# Patient Record
Sex: Male | Born: 1940
Health system: Southern US, Community
[De-identification: ages and names within clinical notes are randomized; demographics above are authoritative.]

## PROBLEM LIST (undated history)

## (undated) DIAGNOSIS — I729 Aneurysm of unspecified site: Secondary | ICD-10-CM

## (undated) DIAGNOSIS — G8929 Other chronic pain: Principal | ICD-10-CM

## (undated) DIAGNOSIS — E785 Hyperlipidemia, unspecified: Secondary | ICD-10-CM

## (undated) DIAGNOSIS — I82409 Acute embolism and thrombosis of unspecified deep veins of unspecified lower extremity: Secondary | ICD-10-CM

## (undated) DIAGNOSIS — I2510 Atherosclerotic heart disease of native coronary artery without angina pectoris: Secondary | ICD-10-CM

## (undated) DIAGNOSIS — I10 Essential (primary) hypertension: Secondary | ICD-10-CM

## (undated) DIAGNOSIS — C801 Malignant (primary) neoplasm, unspecified: Secondary | ICD-10-CM

## (undated) DIAGNOSIS — M545 Low back pain: Principal | ICD-10-CM

## (undated) HISTORY — DX: Atherosclerotic heart disease of native coronary artery without angina pectoris: I25.10

## (undated) HISTORY — DX: Aneurysm of unspecified site: I72.9

## (undated) HISTORY — PX: HERNIA REPAIR: SHX51

## (undated) HISTORY — PX: SHOULDER SURGERY: SHX246

## (undated) HISTORY — DX: Low back pain: M54.5

## (undated) HISTORY — DX: Acute embolism and thrombosis of unspecified deep veins of unspecified lower extremity: I82.409

## (undated) HISTORY — DX: Hyperlipidemia, unspecified: E78.5

## (undated) HISTORY — DX: Other chronic pain: G89.29

## (undated) HISTORY — DX: Essential (primary) hypertension: I10

---

## 2007-05-20 ENCOUNTER — Encounter: Payer: Self-pay | Admitting: Cardiology

## 2007-10-02 HISTORY — PX: CARDIAC CATHETERIZATION: SHX172

## 2007-10-23 ENCOUNTER — Ambulatory Visit: Payer: Self-pay | Admitting: Cardiology

## 2007-10-23 ENCOUNTER — Encounter: Payer: Self-pay | Admitting: Physician Assistant

## 2007-10-24 ENCOUNTER — Encounter: Payer: Self-pay | Admitting: Physician Assistant

## 2007-10-26 ENCOUNTER — Inpatient Hospital Stay (HOSPITAL_BASED_OUTPATIENT_CLINIC_OR_DEPARTMENT_OTHER): Admission: RE | Admit: 2007-10-26 | Discharge: 2007-10-26 | Payer: Self-pay | Admitting: Cardiology

## 2007-10-26 ENCOUNTER — Ambulatory Visit: Payer: Self-pay | Admitting: Cardiology

## 2007-11-10 ENCOUNTER — Encounter: Payer: Self-pay | Admitting: Cardiology

## 2007-11-13 ENCOUNTER — Ambulatory Visit: Payer: Self-pay | Admitting: Cardiology

## 2007-12-07 ENCOUNTER — Ambulatory Visit: Payer: Self-pay | Admitting: Cardiology

## 2008-06-02 ENCOUNTER — Encounter: Payer: Self-pay | Admitting: Cardiology

## 2008-09-08 DIAGNOSIS — E785 Hyperlipidemia, unspecified: Secondary | ICD-10-CM | POA: Insufficient documentation

## 2008-09-08 DIAGNOSIS — I10 Essential (primary) hypertension: Secondary | ICD-10-CM | POA: Insufficient documentation

## 2008-09-08 DIAGNOSIS — I2510 Atherosclerotic heart disease of native coronary artery without angina pectoris: Secondary | ICD-10-CM | POA: Insufficient documentation

## 2008-12-31 ENCOUNTER — Encounter: Payer: Self-pay | Admitting: Cardiology

## 2009-03-25 ENCOUNTER — Encounter: Payer: Self-pay | Admitting: Cardiology

## 2009-03-26 ENCOUNTER — Ambulatory Visit: Payer: Self-pay | Admitting: Cardiology

## 2010-03-02 NOTE — Miscellaneous (Signed)
  Clinical Lists Changes  Problems: Added new problem of * ANEURYSMAL DILITATION OF RAMUS INTERMEDIUS Observations: Added new observation of PAST MED HX: HYPERTENSION HYPERLIPIDEMIA CAD  nonobstructive....cath...10/2007 Aneurysmal dilitation of ramus interrmedius EF  normal at cath 10/2007 Prior regular Etoh use   (03/25/2009 13:49)       Past History:  Past Medical History: HYPERTENSION HYPERLIPIDEMIA CAD  nonobstructive....cath...10/2007 Aneurysmal dilitation of ramus interrmedius EF  normal at cath 10/2007 Prior regular Etoh use

## 2010-03-02 NOTE — Assessment & Plan Note (Signed)
Summary: NO RECENT OV-WANTS REFILS-JM   Visit Type:  Follow-up Primary Provider:  Rondel Jumbo, PA /  Reuel Boom  CC:  CAD.  History of Present Illness: The patient is seen for followup coronary artery disease.  He underwent catheterization in September 2009.  At that time he had aneurysmal dilatation of the ramus intermedius.  He had no other significant disease.  His blood pressure and cholesterol are being treated well by his primary team.  He's not had any significant chest pain or shortness of breath.   Preventive Screening-Counseling & Management  Alcohol-Tobacco     Smoking Status: quit     Year Started: 20 years     Year Quit: 25 yrs ago  Current Medications (verified): 1)  Simvastatin 40 Mg Tabs (Simvastatin) .... Take One Tablet By Mouth Daily At Bedtime 2)  Amlodipine Besylate 10 Mg Tabs (Amlodipine Besylate) .... Take 1 Tablet By Mouth Once A Day 3)  Imdur 30 Mg Xr24h-Tab (Isosorbide Mononitrate) .... Take 1 Tablet By Mouth Once A Day 4)  Nitrostat 0.4 Mg Subl (Nitroglycerin) .Marland Kitchen.. 1 Tablet Under Tongue At Onset of Chest Pain; You May Repeat Every 5 Minutes For Up To 3 Doses.  Allergies (verified): No Known Drug Allergies  Past History:  Past Medical History: HYPERTENSION HYPERLIPIDEMIA CAD  nonobstructive....cath...10/2007 Aneurysmal dilitation of ramus interrmedius EF  normal at cath 10/2007     Review of Systems       Patient denies fever, chills, headache, sweats, rash, change in vision, change in hearing, chest pain, cough, nausea vomiting, urinary symptoms.  All of the systems are reviewed and are negative.  Vital Signs:  Patient profile:   70 year old male Height:      67 inches Weight:      197.50 pounds BMI:     31.04 Pulse rate:   66 / minute BP sitting:   130 / 81  (left arm) Cuff size:   regular  Vitals Entered By: Hoover Brunette, LPN (March 26, 2009 1:33 PM)  Nutrition Counseling: Patient's BMI is greater than 25 and therefore counseled on  weight management options. CC: CAD Is Patient Diabetic? No Comments routine follow up.  Did have surgery on rotator cuff - left, in December last year.     Physical Exam  General:  patient is stable. Head:  head is atraumatic. Eyes:  no xanthelasma. Neck:  no jugular venous distention. Chest Wall:  no chest wall tenderness. Lungs:  lungs are clear.  Respiratory effort is not labored. Heart:  cardiac exam reveals S1 and S2.  No clicks or significant murmurs. Abdomen:  abdomen is soft. Msk:  no musculoskeletal deformities. Extremities:  no peripheral edema. Skin:  no skin rashes. Psych:  patient is oriented to person time and place.  Affect is normal.   Impression & Recommendations:  Problem # 1:  * ANEURYSMAL DILITATION OF RAMUS INTERMEDIUS The patient has some aneurysmal dilatation of the ramus intermedius.  I believe that it is appropriate for him to be on a small dose of aspirin.  This will be started today 81 mg daily.  EKG is done today and reviewed by me.  EKG is normal.  Problem # 2:  HYPERTENSION, UNSPECIFIED (ICD-401.9)  His updated medication list for this problem includes:    Amlodipine Besylate 10 Mg Tabs (Amlodipine besylate) .Marland Kitchen... Take 1 tablet by mouth once a day  Orders: EKG w/ Interpretation (93000)  Blood pressure is well controlled today.  No change in therapy.  Problem #  3:  HYPERLIPIDEMIA-MIXED (ICD-272.4)  The following medications were removed from the medication list:    Simvastatin 20 Mg Tabs (Simvastatin) .Marland Kitchen... Take 1 tablet by mouth at bedtime His updated medication list for this problem includes:    Simvastatin 40 Mg Tabs (Simvastatin) .Marland Kitchen... Take one tablet by mouth daily at bedtime The patient is on treatment for his lipids.  This will be followed by his primary care team.  I'll see him back in one year for followup.  Patient Instructions: 1)  Your physician wants you to follow-up in: 1 year. You will receive a reminder letter in the mail  one-two months in advance. If you don't receive a letter, please call our office to schedule the follow-up appointment.  2)  Your physician recommends that you continue on your current medications as directed. Please refer to the Current Medication list given to you today. Prescriptions: AMLODIPINE BESYLATE 10 MG TABS (AMLODIPINE BESYLATE) Take 1 tablet by mouth once a day  #30 x 12   Entered by:   Cyril Loosen, RN, BSN   Authorized by:   Talitha Givens, MD, [facility]   Signed by:   Cyril Loosen, RN, BSN on 03/26/2009   Method used:   Electronically to        [facility]* (retail)       [address]       [place], [place]  [address]       Ph: [phone]       Fax: [phone]   RxID:   [id number]

## 2010-03-09 ENCOUNTER — Encounter: Payer: Self-pay | Admitting: Cardiology

## 2010-03-10 ENCOUNTER — Encounter: Payer: Self-pay | Admitting: Cardiology

## 2010-04-05 ENCOUNTER — Ambulatory Visit (INDEPENDENT_AMBULATORY_CARE_PROVIDER_SITE_OTHER): Payer: Medicare Other | Admitting: Cardiology

## 2010-04-05 ENCOUNTER — Encounter: Payer: Self-pay | Admitting: Cardiology

## 2010-04-05 DIAGNOSIS — I2510 Atherosclerotic heart disease of native coronary artery without angina pectoris: Secondary | ICD-10-CM

## 2010-04-13 NOTE — Assessment & Plan Note (Signed)
Summary: 1 YR F/U/TR   Visit Type:  Follow-up Primary Provider:  Boykin Nearing, PA /  Reuel Boom  CC:  CAD.  History of Present Illness: The patient is seen for followup of coronary artery disease.  He is doing very well.  He's had some slight chest discomfort.  Not convinced that his angina.  He had swelling in his right leg and was very nicely evaluated.  His venous Doppler revealed old venous disease.  He had DVT in 2006.  This area was recanalized.  He has been placed on Coumadin for an additional 6 months.  He is feeling better.  D-dimer was done and was normal  Preventive Screening-Counseling & Management  Alcohol-Tobacco     Smoking Status: quit     Year Quit: 1985  Current Medications (verified): 1)  Simvastatin 40 Mg Tabs (Simvastatin) .... Take One Tablet By Mouth Daily At Bedtime 2)  Amlodipine Besylate 10 Mg Tabs (Amlodipine Besylate) .... Take 1 Tablet By Mouth Once A Day 3)  Imdur 30 Mg Xr24h-Tab (Isosorbide Mononitrate) .... Take 1 Tablet By Mouth Once A Day 4)  Nitrostat 0.4 Mg Subl (Nitroglycerin) .Marland Kitchen.. 1 Tablet Under Tongue At Onset of Chest Pain; You May Repeat Every 5 Minutes For Up To 3 Doses. 5)  Coumadin 4 Mg Tabs (Warfarin Sodium) .... Use As Directed Managed Katie Skillman  Allergies (verified): No Known Drug Allergies  Comments:  Nurse/Medical Assistant: The patient's medications and allergies were verbally reviewed with the patient and were updated in the Medication and Allergy Lists.  Past History:  Past Medical History: HYPERTENSION HYPERLIPIDEMIA CAD  nonobstructive....cath...10/2007 Aneurysmal dilitation of ramus interrmedius EF  normal at cath 10/2007 DVT   2006 /  right leg swelling February, 2012... d-dimer normal... venous Doppler recannulized chronic DVT of the right femoral vein and right popliteal vein,  no definite new DVT.... 6 months Coumadin by primary care     Review of Systems       Patient denies fever, chills, headache, sweats,  rash, change in vision, change in hearing, cough, nausea vomiting, urinary symptoms.  All of the systems are reviewed and are negative.  Vital Signs:  Patient profile:   70 year old male Height:      67 inches Weight:      199 pounds BMI:     31.28 Pulse rate:   72 / minute BP sitting:   138 / 91  (left arm) Cuff size:   large  Vitals Entered By: Carlye Grippe (April 05, 2010 2:56 PM)  Nutrition Counseling: Patient's BMI is greater than 25 and therefore counseled on weight management options.  Physical Exam  General:  patient is stable. Eyes:  no xanthelasma. Neck:  no jugular venous extension. Lungs:  lungs are clear respiratory effort is nonlabored. Heart:  cardiac exam reveals S1-S2.  No clicks or significant murmurs. Abdomen:  abdomen is soft. Extremities:  no peripheral edema. Psych:  patient is oriented to person time and place.  Affect is normal.   Impression & Recommendations:  Problem # 1:  * ANEURYSMAL DILITATION OF RAMUS INTERMEDIUS This is followup clinically  Problem # 2:  HYPERTENSION, UNSPECIFIED (ICD-401.9) Diastolic pressure slightly elevated today.  His pressure was normal at his primary doctor's office recently.  No change in therapy.  Problem # 3:  CAD, NATIVE VESSEL (ICD-414.01) Coronary disease is stable.  No change in therapy.  EKG is done today and reviewed by.  There is no significant change.  No further workup.  Followup in one year.  Other Orders: EKG w/ Interpretation (93000)  Patient Instructions: 1)  Your physician wants you to follow-up in: 1 year. You will receive a reminder letter in the mail one-two months in advance. If you don't receive a letter, please call our office to schedule the follow-up appointment. 2)  Your physician recommends that you continue on your current medications as directed. Please refer to the Current Medication list given to you today. Prescriptions: NITROSTAT 0.4 MG SUBL (NITROGLYCERIN) 1 tablet under tongue at  onset of chest pain; you may repeat every 5 minutes for up to 3 doses.  #25 x 3   Entered by:   Cyril Loosen, RN, BSN   Authorized by:   Talitha Givens, MD, [facility]   Signed by:   Cyril Loosen, RN, BSN on 04/05/2010   Method used:   Electronically to        [facility]* (retail)       [address]       [place], [place]  [address]       Ph: [phone]       Fax: [phone]   RxID:   [id number]

## 2010-06-15 NOTE — Cardiovascular Report (Signed)
NAME:  Robert Richards, Robert Richards NO.:  [id number]   MEDICAL RECORD NO.:  [id number]          PATIENT TYPE:  OIB   LOCATION:  1967                         FACILITY:  [facility]   PHYSICIAN:  Rollene Rotunda, MD, FACCDATE OF BIRTH:  1940/09/06   DATE OF PROCEDURE:  10/26/2007  DATE OF DISCHARGE:  10/26/2007                            CARDIAC CATHETERIZATION   PRIMARY CARE Claron Rosencrans:  Dr. Selinda Flavin.   CARDIOLOGIST:  Luis Abed, MD, [facility]   REASON FOR PRESENTATION:  The patient with chest pain consistent with  unstable angina.   PROCEDURE NOTE:  Left heart catheterization was performed via the right  femoral artery.  The artery was cannulated using anterior wall puncture.  A #4-French arterial sheath was inserted via the modified Seldinger  technique.  A preformed Judkins and a pigtail catheter were utilized.  The patient tolerated the procedure well and left the lab in stable  condition.   RESULTS:  Hemodynamics:  LV 159/10, AO 159/94.   Coronaries:  Left main was normal.  The LAD had mid luminal  irregularities.  First diagonal was moderate in size and normal.  Second  and third diagonals were tiny and normal.  The circumflex was normal.  There was mid obtuse marginal, which was large and normal.  The ramus  intermediate was moderate-sized with ostial aneurysmal dilatation.  The  right coronary artery had mid luminal irregularities.  Otherwise, this  was a normal vessel.  There is a moderate size PDA, which was normal.  There were 2 small posterolaterals, which were normal.   Left ventriculogram:  The left ventriculogram was obtained in the RAO  projection.  The EF is 65% with normal wall motion.   CONCLUSION:  Nonobstructive coronary artery disease.  The ramus  intermediate has ostial aneurysmal dilatation.  The EF was normal.   PLAN:  We will consider cardiac CT to evaluate the ostial ramus  intermediate though it would be very doubtful that this is a lesion  causing symptoms.  I will review the cath films first with multiple  partners.      Rollene Rotunda, MD, [facility]  Electronically Signed     JH/MEDQ  D:  10/26/2007  T:  10/27/2007  Job:  [id number]   cc:   Yehuda Mao, MD, [facility]

## 2010-06-15 NOTE — Assessment & Plan Note (Signed)
[facility]                          [facility] NOTE   MAKO, PELFREY                      MRN:          [id number]  DATE:10/23/2007                            DOB:          [birth year]    REFERRING PHYSICIAN:  Selinda Flavin   PRIMARY CARDIOLOGIST:  Luis Abed, MD, [facility]   REASON FOR CONSULTATION:  Mr. Gundrum is a very pleasant 70 year old  male, with no documented history of heart disease, who is now referred  directly from Dr. Dimas Aguas office for evaluation of recent development of  exertional angina pectoris and dyspnea.   Mr. Kloth presents with cardiac risk factors notable for  hypertension, tobacco smoking, age, and family history of premature CAD.  He has no known history of diabetes mellitus and denies history of  hyperlipidemia.   Mr. Walla recently relocated from [place] to [place], where he lives  alone, but remains quite active helping family members.  He has 4 grown  children and numerous grandchildren.  While in [place], he was  evaluated for a single episode of chest pain some 5 years ago, for which  he was kept for overnight observation.  He recalls undergoing routine  treadmill testing, but does not recall being treated with radionuclide  imaging material.  He recalls that the test was negative.   The patient states that he did not have any further chest pain until  these past several weeks, during which he has noted development of  significant exertional dyspnea and easy fatigability.  He also refers to  an episode approximately 1 week ago, where he experienced near-syncope  in the setting of chest tightness and significant shortness breath.  This occurred after he had hold of a hand truck carrying several large  vessels of water.  He symptoms did resolve spontaneously with rest.   Mr. Buch states that these symptoms have been ongoing over the past  3-4 weeks.  He denies any chest discomfort at  rest.  A 12-lead  electrocardiogram, completed earlier today at Dr. Dimas Aguas office,  indicates NSR at 81 bpm with normal axis and poor R-wave progression  through lead V3.  There is an isolated inverted T-wave in lead V3.   ALLERGIES:  No known drug allergies.   HOME MEDICATIONS:  Norvasc 5 mg daily   PAST MEDICAL HISTORY:  1. Hypertension.  2. Right lower extremity DVT in 2007.      a.     Treated with Coumadin for 6 months.   SOCIAL HISTORY:  The patient recently relocated from [place] to [place].  He is separated and has 4 grown children.  He lives alone.  He smokes an  occasional cigar, but stopped smoking cigarettes approximately 20 years  ago.  He drinks alcohol on occasion.   FAMILY HISTORY:  Father died at age 37, reportedly secondary to a  massive heart attack.  Mother is 5 year old, alive and well.   REVIEW OF SYSTEMS:  He denies history of myocardial infarction,  congestive heart failure, or stroke.  He denies history of diabetes  mellitus.  He does  have occasional reflux symptoms.  Reports recent  development of occasional PND, he has had to sleep on 2 to 3 pillows,  but denies any lower extremity edema.  He denies intermittent  claudication.  Otherwise as per HPI, remaining systems negative.   PHYSICAL EXAMINATION:  VITAL SIGNS:  Blood pressure 143/111, pulse 84,  regular, and weight 174.  GENERAL:  A 70 year old male, sitting upright, in no distress.  HEENT:  Normocephalic, atraumatic.  PERRLA.  EOMI.  NECK:  Palpable carotid pulse without bruits; no JVD.  LUNGS:  Clear to auscultation in all fields.  HEART:  Regular rate and rhythm (S1 and S2) positive S4.  No significant  murmurs.  No rubs.  ABDOMEN:  Soft, nontender, and intact bowel sounds.  No epigastric  bruit.  EXTREMITIES:  Palpable femoral pulses without bruits; palpable right  dorsalis pedis pulses with minimally palpable left dorsalis pedis pulse.  Trace pedal edema.  SKIN:  Warm and dry.   MUSCULOSKELETAL:  No gross deformity.  NEUROLOGIC:  No focal deficit.   IMPRESSION:  1. New-onset exertional chest discomfort/dyspnea.      a.     Worrisome for significant coronary artery disease.      b.     History of remote negative routine treadmill test.  2. Multiple cardiac risk factors.      a.     Hypertension.      b.     Tobacco.      c.     Age.      d.     Family history.  3. Status post right lower extremity deep vein thrombosis in 2007.      a.     Treated with Coumadin.   PLAN:  1. Recommendation is to proceed with a diagnostic coronary angiogram      later this week, to be scheduled in our [facility] catheterization lab.      The patient is agreeable with this plan and risks/benefits were      discussed.  2. Add full dose aspirin, Lopressor 25 mg b.i.d., p.r.n., and      nitroglycerin to current medication regimen.  The patient is to      continue on home dose of Norvasc.      Gene Serpe, PA-C       Jonelle Sidle, MD    GS/MedQ  DD: 10/23/2007  DT: 10/24/2007  Job #: [id number]   cc:   Selinda Flavin

## 2010-06-15 NOTE — Assessment & Plan Note (Signed)
[facility]                          [facility] NOTE   Robert, Richards                      MRN:          [id number]  DATE:12/07/2007                            DOB:          October 30, 1940    PRIMARY CARDIOLOGIST:  Luis Abed, MD, [facility]   REASON FOR VISIT:  Scheduled clinic followup.  Please refer to Dr.  Ival Bible recent office note of November 13, 2007, for full details.   Since this last visit, Mr. Robert Richards refers to a single episode of chest  pressure which occurred shortly after he helped his mother back in the  wheelchair.  This was mild and probably relieved by one nitroglycerin  tablet.  He has not had any recurrent symptoms, and otherwise denies any  recent development of exertional angina pectoris.  He otherwise feels  that he is doing extremely well.   Recent workup consisted of a fasting lipid profile, notable for total  cholesterol of 206, triglyceride 72, HDL 85, and LDL 107.  The patient  is currently not on a statin.   MEDICATIONS:  1. Aspirin 325 daily.  2. Lopressor 50 b.i.d.  3. Norvasc 5 daily.   PHYSICAL EXAMINATION:  VITAL SIGNS:  Blood pressure 145/89, pulse 67,  regular weight 181.  GENERAL:  A 70 year old male, sitting upright, in no distress.  HEENT:  Normocephalic, atraumatic.  NECK:  Palpable carotid pulses without bruits; no JVD.  LUNGS:  Clear to auscultation in all fields.  HEART:  Regular rate and rhythm.  No significant murmurs.  No rubs.  ABDOMEN:  Soft, nontender.  EXTREMITIES:  No edema.  NEURO:  No focal deficit.   IMPRESSION:  1. Nonobstructive coronary artery disease.      a.     By recent cardiac catheterization.      b.     Ostial, aneurysmal dilatation of ramus intermedius.      c.     Normal left ventricle.  2. Dyslipidemia.  3. Hypertension.  4. Remote tobacco.   PLAN:  1. Adjust medication regimen with up titration of Norvasc for more      aggressive blood pressure  control.  Addition of Imdur 30 mg daily      for antianginal benefit.  Addition of simvastatin 20      mg daily, for lipid management.  Followup FLP/LFT profile in 12      weeks.  2. Schedule return clinic followup with myself and Dr. Myrtis Ser in 4      months.      Gene Serpe, PA-C  Electronically Signed      Learta Codding, MD,FACC  Electronically Signed   GS/MedQ  DD: 12/07/2007  DT: 12/08/2007  Job #: [id number]   cc:   Selinda Flavin

## 2010-06-15 NOTE — Assessment & Plan Note (Signed)
[facility]                          [facility] NOTE   Robert Richards, Robert Richards                      MRN:          [id number]  DATE:11/13/2007                            DOB:          05-Apr-1940    PRIMARY CARDIOLOGIST:  Luis Abed, MD, [facility].   PRIMARY CARE PHYSICIAN:  Dr. Selinda Flavin   REASON FOR VISIT:  Post cardiac catheterization followup.   HISTORY OF PRESENT ILLNESS:  Robert Richards is a 70 year old gentleman  seen by Dr. Myrtis Ser back on October 23, 2007 (my electronic signature on  that note is incorrect) with a longstanding history of hypertension,  occasional cigar smoking with remote cigarette use history, and a prior  history of regular alcohol use.  He was referred to Dr. Myrtis Ser with a  history of chest pain as well as exertional shortness of breath in the  face of previous reassuring noninvasive ischemic evaluation.  The  patient was referred for a diagnostic cardiac catheterization, which was  performed by Dr. Antoine Poche on October 26, 2007.  This study revealed  fairly reassuring coronary anatomy with mild atherosclerosis  predominately noted in the right coronary artery.  There was description  of ostial aneurysmal dilatation involving a moderate-sized ramus  intermedius, although no clear obstruction was noted.  Dr. Jenene Slicker  note mentions the possibility of further anatomic assessment of this  area with a cardiac CT scan, although in speaking with Dr. Antoine Poche  directly by phone today, this was not arranged and he felt that the area  was likely noncontributory to any symptom complex and could be followed  medically.  No other major aneurysmal change was noted in the remaining  coronaries.  Symptomatically, Robert Richards denies having any further  chest pain.  Of note, he is back on his Norvasc, which he had been out  of for several days prior to his original presentation in addition to  now taking aspirin and Lopressor  regularly.  He has not had to use any  sublingual nitroglycerin and reports that his catheterization site has  healed well.  Today, I reviewed with him implications for long-term  management of high blood pressure as well as understanding his lipid  status.  Clearly, complete smoking cessation would be of benefit as  well.   ALLERGIES:  No known drug allergies.   MEDICATIONS:  1. Aspirin 325 mg p.o. daily.  2. Norvasc 5 mg p.o. daily.  3. Lopressor 50 mg p.o. b.i.d.  4. Sublingual nitroglycerin 0.4 mg p.r.n.   REVIEW OF SYSTEMS:  As described in the history of present illness.  Otherwise negative.   PHYSICAL EXAMINATION:  VITAL SIGNS:  Blood pressure today 150/98  (improved overall), heart rate 63, weight 178 pounds.  The patient is  comfortable, in no acute distress.  HEENT:  Conjunctivae are normal.  Oropharynx is clear.  NECK:  Supple.  No elevated jugular venous pressure, no loud bruits.  No  thyromegaly is noted.  LUNGS:  Clear without labored breathing at rest.  CARDIAC:  Reveals a regular rate and rhythm.  Soft S4.  No S3 gallop.  No pericardial rub.  ABDOMEN:  Soft and nontender.  No active bowel sounds.  EXTREMITIES:  Exhibit no significant pitting edema.  Distal pulses are  2+.  SKIN:  Warm and dry.  MUSCULOSKELETAL:  No kyphosis noted.  NEUROPSYCHIATRIC:  The patient is alert and oriented x3.  Affect is  appropriate.   IMPRESSION AND RECOMMENDATIONS:  Recent history of chest pain, now  resolved, and status post cardiac catheterization with demonstration of  mild coronary atherosclerosis, some ostial aneurysmal change involving a  ramus intermedius (likely noncontributory to symptom complex), in the  face of hypertension with recent medication adjustments.  Robert Richards  is feeling much better symptomatically, denying any active chest pain or  limiting breathlessness.  Today, I reviewed his medications and we  talked about the fact that he may actually need further  uptitration of  his Norvasc depending on his blood pressure trend over time.  We also  spoke about a fasting lipid profile to better understand his lipid  numbers and determine whether he might benefit from use of a long-term  statin medication.  I spoke with Dr. Antoine Poche about the cardiac  catheterization findings and he stated that he would review these films  with some of our interventional colleagues, although felt that the  aneurysmal ostial change of the ramus intermedius was most likely  noncontributory in terms of symptom complex and could be followed on  medical therapy and basic risk factor modification strategies.  Clinical  follow up will be scheduled with Dr. Myrtis Ser over the next several weeks.     Jonelle Sidle, MD  Electronically Signed    SGM/MedQ  DD: 11/13/2007  DT: 11/13/2007  Job #: [id number]   cc:   Luis Abed, MD, [facility]  Selinda Flavin

## 2011-02-08 DIAGNOSIS — I82409 Acute embolism and thrombosis of unspecified deep veins of unspecified lower extremity: Secondary | ICD-10-CM | POA: Diagnosis not present

## 2011-03-08 DIAGNOSIS — I82409 Acute embolism and thrombosis of unspecified deep veins of unspecified lower extremity: Secondary | ICD-10-CM | POA: Diagnosis not present

## 2011-03-14 DIAGNOSIS — I82409 Acute embolism and thrombosis of unspecified deep veins of unspecified lower extremity: Secondary | ICD-10-CM | POA: Diagnosis not present

## 2011-03-14 DIAGNOSIS — M1990 Unspecified osteoarthritis, unspecified site: Secondary | ICD-10-CM | POA: Diagnosis not present

## 2011-03-14 DIAGNOSIS — E782 Mixed hyperlipidemia: Secondary | ICD-10-CM | POA: Diagnosis not present

## 2011-03-14 DIAGNOSIS — I10 Essential (primary) hypertension: Secondary | ICD-10-CM | POA: Diagnosis not present

## 2011-03-21 DIAGNOSIS — I10 Essential (primary) hypertension: Secondary | ICD-10-CM | POA: Diagnosis not present

## 2011-03-21 DIAGNOSIS — M1990 Unspecified osteoarthritis, unspecified site: Secondary | ICD-10-CM | POA: Diagnosis not present

## 2011-03-21 DIAGNOSIS — E782 Mixed hyperlipidemia: Secondary | ICD-10-CM | POA: Diagnosis not present

## 2011-03-21 DIAGNOSIS — I82409 Acute embolism and thrombosis of unspecified deep veins of unspecified lower extremity: Secondary | ICD-10-CM | POA: Diagnosis not present

## 2011-04-01 ENCOUNTER — Encounter: Payer: Self-pay | Admitting: *Deleted

## 2011-04-05 DIAGNOSIS — I82409 Acute embolism and thrombosis of unspecified deep veins of unspecified lower extremity: Secondary | ICD-10-CM | POA: Diagnosis not present

## 2011-04-15 DIAGNOSIS — M2550 Pain in unspecified joint: Secondary | ICD-10-CM | POA: Diagnosis not present

## 2011-04-15 DIAGNOSIS — S93409A Sprain of unspecified ligament of unspecified ankle, initial encounter: Secondary | ICD-10-CM | POA: Diagnosis not present

## 2011-04-18 DIAGNOSIS — M1710 Unilateral primary osteoarthritis, unspecified knee: Secondary | ICD-10-CM | POA: Diagnosis not present

## 2011-04-18 DIAGNOSIS — M25569 Pain in unspecified knee: Secondary | ICD-10-CM | POA: Diagnosis not present

## 2011-05-10 DIAGNOSIS — I82409 Acute embolism and thrombosis of unspecified deep veins of unspecified lower extremity: Secondary | ICD-10-CM | POA: Diagnosis not present

## 2011-06-20 DIAGNOSIS — I82409 Acute embolism and thrombosis of unspecified deep veins of unspecified lower extremity: Secondary | ICD-10-CM | POA: Diagnosis not present

## 2011-07-19 DIAGNOSIS — I82409 Acute embolism and thrombosis of unspecified deep veins of unspecified lower extremity: Secondary | ICD-10-CM | POA: Diagnosis not present

## 2011-07-19 DIAGNOSIS — E782 Mixed hyperlipidemia: Secondary | ICD-10-CM | POA: Diagnosis not present

## 2011-07-19 DIAGNOSIS — N400 Enlarged prostate without lower urinary tract symptoms: Secondary | ICD-10-CM | POA: Diagnosis not present

## 2011-07-19 DIAGNOSIS — I10 Essential (primary) hypertension: Secondary | ICD-10-CM | POA: Diagnosis not present

## 2011-07-26 DIAGNOSIS — M1990 Unspecified osteoarthritis, unspecified site: Secondary | ICD-10-CM | POA: Diagnosis not present

## 2011-07-26 DIAGNOSIS — I10 Essential (primary) hypertension: Secondary | ICD-10-CM | POA: Diagnosis not present

## 2011-07-26 DIAGNOSIS — I82409 Acute embolism and thrombosis of unspecified deep veins of unspecified lower extremity: Secondary | ICD-10-CM | POA: Diagnosis not present

## 2011-07-26 DIAGNOSIS — E782 Mixed hyperlipidemia: Secondary | ICD-10-CM | POA: Diagnosis not present

## 2011-08-16 DIAGNOSIS — I82409 Acute embolism and thrombosis of unspecified deep veins of unspecified lower extremity: Secondary | ICD-10-CM | POA: Diagnosis not present

## 2011-08-30 DIAGNOSIS — I82409 Acute embolism and thrombosis of unspecified deep veins of unspecified lower extremity: Secondary | ICD-10-CM | POA: Diagnosis not present

## 2011-09-27 DIAGNOSIS — I82409 Acute embolism and thrombosis of unspecified deep veins of unspecified lower extremity: Secondary | ICD-10-CM | POA: Diagnosis not present

## 2011-09-30 DIAGNOSIS — I82409 Acute embolism and thrombosis of unspecified deep veins of unspecified lower extremity: Secondary | ICD-10-CM | POA: Diagnosis not present

## 2011-10-07 DIAGNOSIS — I82409 Acute embolism and thrombosis of unspecified deep veins of unspecified lower extremity: Secondary | ICD-10-CM | POA: Diagnosis not present

## 2011-10-14 DIAGNOSIS — I82409 Acute embolism and thrombosis of unspecified deep veins of unspecified lower extremity: Secondary | ICD-10-CM | POA: Diagnosis not present

## 2011-10-18 DIAGNOSIS — I82409 Acute embolism and thrombosis of unspecified deep veins of unspecified lower extremity: Secondary | ICD-10-CM | POA: Diagnosis not present

## 2011-10-25 DIAGNOSIS — I82409 Acute embolism and thrombosis of unspecified deep veins of unspecified lower extremity: Secondary | ICD-10-CM | POA: Diagnosis not present

## 2011-11-07 DIAGNOSIS — I82409 Acute embolism and thrombosis of unspecified deep veins of unspecified lower extremity: Secondary | ICD-10-CM | POA: Diagnosis not present

## 2011-12-06 DIAGNOSIS — I82409 Acute embolism and thrombosis of unspecified deep veins of unspecified lower extremity: Secondary | ICD-10-CM | POA: Diagnosis not present

## 2011-12-16 DIAGNOSIS — I10 Essential (primary) hypertension: Secondary | ICD-10-CM | POA: Diagnosis not present

## 2011-12-16 DIAGNOSIS — M1990 Unspecified osteoarthritis, unspecified site: Secondary | ICD-10-CM | POA: Diagnosis not present

## 2011-12-16 DIAGNOSIS — E782 Mixed hyperlipidemia: Secondary | ICD-10-CM | POA: Diagnosis not present

## 2011-12-20 DIAGNOSIS — E782 Mixed hyperlipidemia: Secondary | ICD-10-CM | POA: Diagnosis not present

## 2011-12-20 DIAGNOSIS — I82409 Acute embolism and thrombosis of unspecified deep veins of unspecified lower extremity: Secondary | ICD-10-CM | POA: Diagnosis not present

## 2011-12-20 DIAGNOSIS — M1990 Unspecified osteoarthritis, unspecified site: Secondary | ICD-10-CM | POA: Diagnosis not present

## 2011-12-20 DIAGNOSIS — I10 Essential (primary) hypertension: Secondary | ICD-10-CM | POA: Diagnosis not present

## 2012-01-03 DIAGNOSIS — I82409 Acute embolism and thrombosis of unspecified deep veins of unspecified lower extremity: Secondary | ICD-10-CM | POA: Diagnosis not present

## 2012-01-17 DIAGNOSIS — I82409 Acute embolism and thrombosis of unspecified deep veins of unspecified lower extremity: Secondary | ICD-10-CM | POA: Diagnosis not present

## 2012-01-23 DIAGNOSIS — I82409 Acute embolism and thrombosis of unspecified deep veins of unspecified lower extremity: Secondary | ICD-10-CM | POA: Diagnosis not present

## 2012-02-07 DIAGNOSIS — I82409 Acute embolism and thrombosis of unspecified deep veins of unspecified lower extremity: Secondary | ICD-10-CM | POA: Diagnosis not present

## 2012-02-14 DIAGNOSIS — I82409 Acute embolism and thrombosis of unspecified deep veins of unspecified lower extremity: Secondary | ICD-10-CM | POA: Diagnosis not present

## 2012-02-21 DIAGNOSIS — M76899 Other specified enthesopathies of unspecified lower limb, excluding foot: Secondary | ICD-10-CM | POA: Diagnosis not present

## 2012-02-29 DIAGNOSIS — I82409 Acute embolism and thrombosis of unspecified deep veins of unspecified lower extremity: Secondary | ICD-10-CM | POA: Diagnosis not present

## 2012-03-30 DIAGNOSIS — I82409 Acute embolism and thrombosis of unspecified deep veins of unspecified lower extremity: Secondary | ICD-10-CM | POA: Diagnosis not present

## 2012-04-11 DIAGNOSIS — I82409 Acute embolism and thrombosis of unspecified deep veins of unspecified lower extremity: Secondary | ICD-10-CM | POA: Diagnosis not present

## 2012-04-18 DIAGNOSIS — I82409 Acute embolism and thrombosis of unspecified deep veins of unspecified lower extremity: Secondary | ICD-10-CM | POA: Diagnosis not present

## 2012-05-03 DIAGNOSIS — I82409 Acute embolism and thrombosis of unspecified deep veins of unspecified lower extremity: Secondary | ICD-10-CM | POA: Diagnosis not present

## 2012-05-03 DIAGNOSIS — M25559 Pain in unspecified hip: Secondary | ICD-10-CM | POA: Diagnosis not present

## 2012-06-18 DIAGNOSIS — I82409 Acute embolism and thrombosis of unspecified deep veins of unspecified lower extremity: Secondary | ICD-10-CM | POA: Diagnosis not present

## 2012-06-27 DIAGNOSIS — I82409 Acute embolism and thrombosis of unspecified deep veins of unspecified lower extremity: Secondary | ICD-10-CM | POA: Diagnosis not present

## 2012-06-27 DIAGNOSIS — E782 Mixed hyperlipidemia: Secondary | ICD-10-CM | POA: Diagnosis not present

## 2012-06-27 DIAGNOSIS — M1990 Unspecified osteoarthritis, unspecified site: Secondary | ICD-10-CM | POA: Diagnosis not present

## 2012-06-27 DIAGNOSIS — M2550 Pain in unspecified joint: Secondary | ICD-10-CM | POA: Diagnosis not present

## 2012-06-27 DIAGNOSIS — M25559 Pain in unspecified hip: Secondary | ICD-10-CM | POA: Diagnosis not present

## 2012-06-27 DIAGNOSIS — I10 Essential (primary) hypertension: Secondary | ICD-10-CM | POA: Diagnosis not present

## 2012-07-03 DIAGNOSIS — I10 Essential (primary) hypertension: Secondary | ICD-10-CM | POA: Diagnosis not present

## 2012-07-03 DIAGNOSIS — I82409 Acute embolism and thrombosis of unspecified deep veins of unspecified lower extremity: Secondary | ICD-10-CM | POA: Diagnosis not present

## 2012-08-01 DIAGNOSIS — I82409 Acute embolism and thrombosis of unspecified deep veins of unspecified lower extremity: Secondary | ICD-10-CM | POA: Diagnosis not present

## 2012-08-22 ENCOUNTER — Encounter (HOSPITAL_COMMUNITY): Payer: Self-pay | Admitting: *Deleted

## 2012-08-22 ENCOUNTER — Emergency Department (HOSPITAL_COMMUNITY)
Admission: EM | Admit: 2012-08-22 | Discharge: 2012-08-23 | Disposition: A | Discharge status: Home / Self Care | Payer: Medicare Other | Attending: Emergency Medicine | Admitting: Emergency Medicine

## 2012-08-22 ENCOUNTER — Emergency Department (HOSPITAL_COMMUNITY): Payer: Medicare Other

## 2012-08-22 DIAGNOSIS — Z79899 Other long term (current) drug therapy: Secondary | ICD-10-CM | POA: Diagnosis not present

## 2012-08-22 DIAGNOSIS — R52 Pain, unspecified: Secondary | ICD-10-CM | POA: Diagnosis not present

## 2012-08-22 DIAGNOSIS — M79605 Pain in left leg: Secondary | ICD-10-CM

## 2012-08-22 DIAGNOSIS — G8929 Other chronic pain: Secondary | ICD-10-CM | POA: Diagnosis not present

## 2012-08-22 DIAGNOSIS — M545 Low back pain, unspecified: Secondary | ICD-10-CM | POA: Insufficient documentation

## 2012-08-22 DIAGNOSIS — Z8679 Personal history of other diseases of the circulatory system: Secondary | ICD-10-CM | POA: Insufficient documentation

## 2012-08-22 DIAGNOSIS — M549 Dorsalgia, unspecified: Secondary | ICD-10-CM

## 2012-08-22 DIAGNOSIS — M5380 Other specified dorsopathies, site unspecified: Secondary | ICD-10-CM | POA: Diagnosis not present

## 2012-08-22 DIAGNOSIS — M62830 Muscle spasm of back: Secondary | ICD-10-CM

## 2012-08-22 DIAGNOSIS — I10 Essential (primary) hypertension: Secondary | ICD-10-CM | POA: Insufficient documentation

## 2012-08-22 DIAGNOSIS — Z9861 Coronary angioplasty status: Secondary | ICD-10-CM | POA: Diagnosis not present

## 2012-08-22 DIAGNOSIS — S79919A Unspecified injury of unspecified hip, initial encounter: Secondary | ICD-10-CM | POA: Diagnosis not present

## 2012-08-22 DIAGNOSIS — Z87828 Personal history of other (healed) physical injury and trauma: Secondary | ICD-10-CM | POA: Insufficient documentation

## 2012-08-22 DIAGNOSIS — Z87891 Personal history of nicotine dependence: Secondary | ICD-10-CM | POA: Diagnosis not present

## 2012-08-22 DIAGNOSIS — I2510 Atherosclerotic heart disease of native coronary artery without angina pectoris: Secondary | ICD-10-CM | POA: Insufficient documentation

## 2012-08-22 DIAGNOSIS — Z862 Personal history of diseases of the blood and blood-forming organs and certain disorders involving the immune mechanism: Secondary | ICD-10-CM | POA: Insufficient documentation

## 2012-08-22 DIAGNOSIS — M25559 Pain in unspecified hip: Secondary | ICD-10-CM | POA: Diagnosis not present

## 2012-08-22 DIAGNOSIS — Z86718 Personal history of other venous thrombosis and embolism: Secondary | ICD-10-CM | POA: Diagnosis not present

## 2012-08-22 DIAGNOSIS — Z8639 Personal history of other endocrine, nutritional and metabolic disease: Secondary | ICD-10-CM | POA: Insufficient documentation

## 2012-08-22 DIAGNOSIS — IMO0002 Reserved for concepts with insufficient information to code with codable children: Secondary | ICD-10-CM | POA: Diagnosis not present

## 2012-08-22 DIAGNOSIS — S79929A Unspecified injury of unspecified thigh, initial encounter: Secondary | ICD-10-CM | POA: Diagnosis not present

## 2012-08-22 MED ORDER — DIAZEPAM 5 MG/ML IJ SOLN
10.0000 mg | Freq: Once | INTRAMUSCULAR | Status: AC
  Administered 2012-08-22: 10 mg via INTRAMUSCULAR
  Filled 2012-08-22: qty 2

## 2012-08-22 MED ORDER — OXYCODONE-ACETAMINOPHEN 5-325 MG PO TABS
1.0000 | ORAL_TABLET | Freq: Once | ORAL | Status: AC
  Administered 2012-08-22: 1 via ORAL

## 2012-08-22 MED ORDER — DEXAMETHASONE SODIUM PHOSPHATE 4 MG/ML IJ SOLN
10.0000 mg | Freq: Once | INTRAMUSCULAR | Status: AC
  Administered 2012-08-22: 10 mg via INTRAMUSCULAR
  Filled 2012-08-22: qty 3

## 2012-08-22 MED ORDER — MORPHINE SULFATE 4 MG/ML IJ SOLN
4.0000 mg | Freq: Once | INTRAMUSCULAR | Status: AC
  Administered 2012-08-22: 4 mg via INTRAMUSCULAR
  Filled 2012-08-22: qty 1

## 2012-08-22 MED ORDER — OXYCODONE-ACETAMINOPHEN 5-325 MG PO TABS
ORAL_TABLET | ORAL | Status: AC
  Filled 2012-08-22: qty 1

## 2012-08-22 NOTE — ED Notes (Signed)
Patient was screaming in pain. When entering the room patient was hanging off the bed to get relief. Let his nurse Payton Doughty RN know he was yelling in pain. Wife asked if there was anything that he could have. Wife at desk very upset that her husband was hurting and there was nothing that she could do. Put pillow under the hip that was hurting but patient states that nothing is giving him relief.

## 2012-08-22 NOTE — ED Notes (Signed)
Pt reporting pain in left hip radiating down leg.  Reporting increased pain today.

## 2012-08-22 NOTE — ED Notes (Signed)
Family member at bedside woke pt and pt once again yelling he was in pain. Family member threatening to ' yank DR in room".  Security asked to make themselves visible.
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
## 2012-08-22 NOTE — ED Notes (Signed)
Patient's wife is standing in the  Hallway waiting for Dr to come in the room. States that she" might jump on the Dr" when they come in the room. Wife is very angry pacing the hallway.

## 2012-08-22 NOTE — ED Notes (Signed)
Pt remains in sever pain . Ice pack applied.

## 2012-08-22 NOTE — ED Notes (Signed)
Pt's wife out to desk asking to have help for husbands pain. EDp notified. protocal initiated of percocet. Charge nurse also notified of family's concerns.

## 2012-08-22 NOTE — ED Notes (Signed)
Pt presents with left lower back and hip pain after raising up from being on his knees. Pt states pain is severe. Denies injury/fall at this time. Bilateral legs remain the same length.

## 2012-08-23 MED ORDER — HYDROCODONE-ACETAMINOPHEN 5-325 MG PO TABS: ORAL_TABLET | ORAL | Status: DC

## 2012-08-23 MED ORDER — PREDNISONE 20 MG PO TABS: ORAL_TABLET | ORAL | Status: DC

## 2012-08-23 MED ORDER — DIAZEPAM 5 MG PO TABS: ORAL_TABLET | ORAL | Status: DC

## 2012-08-28 DIAGNOSIS — M545 Low back pain, unspecified: Secondary | ICD-10-CM | POA: Diagnosis not present

## 2012-08-31 DIAGNOSIS — M47817 Spondylosis without myelopathy or radiculopathy, lumbosacral region: Secondary | ICD-10-CM | POA: Diagnosis not present

## 2012-08-31 DIAGNOSIS — M5126 Other intervertebral disc displacement, lumbar region: Secondary | ICD-10-CM | POA: Diagnosis not present

## 2012-08-31 DIAGNOSIS — M4310 Spondylolisthesis, site unspecified: Secondary | ICD-10-CM | POA: Diagnosis not present

## 2012-09-27 ENCOUNTER — Ambulatory Visit (INDEPENDENT_AMBULATORY_CARE_PROVIDER_SITE_OTHER): Payer: Medicare Other | Admitting: Neurology

## 2012-09-27 ENCOUNTER — Encounter: Payer: Self-pay | Admitting: Neurology

## 2012-09-27 VITALS — BP 155/96 | HR 73 | Ht 66.0 in | Wt 184.5 lb

## 2012-09-27 DIAGNOSIS — M545 Low back pain, unspecified: Secondary | ICD-10-CM

## 2012-09-27 DIAGNOSIS — IMO0002 Reserved for concepts with insufficient information to code with codable children: Secondary | ICD-10-CM

## 2012-09-27 DIAGNOSIS — M5416 Radiculopathy, lumbar region: Secondary | ICD-10-CM

## 2012-09-27 MED ORDER — GABAPENTIN 300 MG PO CAPS: 300.0000 mg | ORAL_CAPSULE | Freq: Three times a day (TID) | ORAL | Status: DC
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
## 2012-09-27 NOTE — Patient Instructions (Signed)
Overall you are doing fairly well but I do want to suggest a few things today:   Remember to drink plenty of fluid, eat healthy meals and do not skip any meals. Try to eat protein with a every meal and eat a healthy snack such as fruit or nuts in between meals. Try to keep a regular sleep-wake schedule and try to exercise daily, particularly in the form of walking, 20-30 minutes a day, if you can.   As far as your medications are concerned, I would like to suggest starting a medication called gabapentin. You will start by taking 1 capsule nightly for 3 days, then increase to 1 capsule twice a day, then increase to 1 capsule three times a day  As far as diagnostic testing: I would like to get an EMG/NCS. We will schedule this test for you  I would like to see you back in 2 to 3 months, sooner if we need to. Please call us with any interim questions, concerns, problems, updates or refill requests.   Please also call us for any test results so we can go over those with you on the phone.  My clinical assistant and will answer any of your questions and relay your messages to me and also relay most of my messages to you.   Our phone number is [phone]. We also have an after hours call service for urgent matters and there is a physician on-call for urgent questions. For any emergencies you know to call 911 or go to the nearest emergency room

## 2012-10-05 DIAGNOSIS — I82409 Acute embolism and thrombosis of unspecified deep veins of unspecified lower extremity: Secondary | ICD-10-CM | POA: Diagnosis not present

## 2012-10-30 ENCOUNTER — Encounter: Payer: Self-pay | Admitting: Neurology

## 2012-10-31 ENCOUNTER — Encounter: Payer: Medicare Other | Admitting: Neurology

## 2012-11-05 DIAGNOSIS — I82409 Acute embolism and thrombosis of unspecified deep veins of unspecified lower extremity: Secondary | ICD-10-CM | POA: Diagnosis not present

## 2012-11-27 ENCOUNTER — Ambulatory Visit: Payer: Medicare Other | Admitting: Neurology

## 2012-12-10 ENCOUNTER — Encounter: Payer: Self-pay | Admitting: Neurology

## 2012-12-10 ENCOUNTER — Ambulatory Visit (INDEPENDENT_AMBULATORY_CARE_PROVIDER_SITE_OTHER): Payer: Medicare Other | Admitting: Neurology

## 2012-12-10 ENCOUNTER — Encounter (INDEPENDENT_AMBULATORY_CARE_PROVIDER_SITE_OTHER): Payer: Self-pay

## 2012-12-10 VITALS — BP 148/89 | HR 81 | Ht 67.0 in | Wt 190.0 lb

## 2012-12-10 DIAGNOSIS — M545 Low back pain, unspecified: Secondary | ICD-10-CM

## 2012-12-10 MED ORDER — GABAPENTIN 400 MG PO CAPS: 400.0000 mg | ORAL_CAPSULE | Freq: Three times a day (TID) | ORAL | Status: DC

## 2012-12-10 NOTE — Addendum Note (Signed)
Addended by: Ramond Marrow on: 12/10/2012 03:43 PM   Modules accepted: Orders
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
## 2012-12-14 ENCOUNTER — Other Ambulatory Visit: Payer: Self-pay | Admitting: Neurology

## 2012-12-14 DIAGNOSIS — M545 Low back pain, unspecified: Secondary | ICD-10-CM

## 2012-12-19 DIAGNOSIS — I82409 Acute embolism and thrombosis of unspecified deep veins of unspecified lower extremity: Secondary | ICD-10-CM | POA: Diagnosis not present

## 2012-12-26 DIAGNOSIS — I82409 Acute embolism and thrombosis of unspecified deep veins of unspecified lower extremity: Secondary | ICD-10-CM | POA: Diagnosis not present

## 2013-01-01 DIAGNOSIS — E782 Mixed hyperlipidemia: Secondary | ICD-10-CM | POA: Diagnosis not present

## 2013-01-01 DIAGNOSIS — I82409 Acute embolism and thrombosis of unspecified deep veins of unspecified lower extremity: Secondary | ICD-10-CM | POA: Diagnosis not present

## 2013-01-01 DIAGNOSIS — I10 Essential (primary) hypertension: Secondary | ICD-10-CM | POA: Diagnosis not present

## 2013-01-01 DIAGNOSIS — M1990 Unspecified osteoarthritis, unspecified site: Secondary | ICD-10-CM | POA: Diagnosis not present

## 2013-01-18 DIAGNOSIS — M5126 Other intervertebral disc displacement, lumbar region: Secondary | ICD-10-CM | POA: Diagnosis not present

## 2013-01-18 DIAGNOSIS — M47817 Spondylosis without myelopathy or radiculopathy, lumbosacral region: Secondary | ICD-10-CM | POA: Diagnosis not present

## 2013-02-06 DIAGNOSIS — I82409 Acute embolism and thrombosis of unspecified deep veins of unspecified lower extremity: Secondary | ICD-10-CM | POA: Diagnosis not present

## 2013-02-20 DIAGNOSIS — I82409 Acute embolism and thrombosis of unspecified deep veins of unspecified lower extremity: Secondary | ICD-10-CM | POA: Diagnosis not present

## 2013-03-06 DIAGNOSIS — I82409 Acute embolism and thrombosis of unspecified deep veins of unspecified lower extremity: Secondary | ICD-10-CM | POA: Diagnosis not present

## 2013-03-13 DIAGNOSIS — I82409 Acute embolism and thrombosis of unspecified deep veins of unspecified lower extremity: Secondary | ICD-10-CM | POA: Diagnosis not present

## 2013-04-09 ENCOUNTER — Ambulatory Visit (INDEPENDENT_AMBULATORY_CARE_PROVIDER_SITE_OTHER): Payer: Medicare Other | Admitting: Neurology

## 2013-04-09 ENCOUNTER — Encounter (INDEPENDENT_AMBULATORY_CARE_PROVIDER_SITE_OTHER): Payer: Self-pay

## 2013-04-09 ENCOUNTER — Encounter: Payer: Self-pay | Admitting: Neurology

## 2013-04-09 VITALS — BP 134/88 | HR 70 | Ht 67.25 in | Wt 186.0 lb

## 2013-04-09 DIAGNOSIS — M545 Low back pain, unspecified: Secondary | ICD-10-CM

## 2013-04-09 NOTE — Patient Instructions (Signed)
Overall you are doing fairly well but I do want to suggest a few things today:   Remember to drink plenty of fluid, eat healthy meals and do not skip any meals. Try to eat protein with a every meal and eat a healthy snack such as fruit or nuts in between meals. Try to keep a regular sleep-wake schedule and try to exercise daily, particularly in the form of walking, 20-30 minutes a day, if you can.   As far as your medications are concerned, I would like to suggest continuing you on gabapentin 300mg  three times a day.  I would like to see you back in 6 months, sooner if we need to. Please call us with any interim questions, concerns, problems, updates or refill requests.   My clinical assistant and will answer any of your questions and relay your messages to me and also relay most of my messages to you.   Our phone number is [phone]. We also have an after hours call service for urgent matters and there is a physician on-call for urgent questions. For any emergencies you know to call 911 or go to the nearest emergency room
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
## 2013-05-02 DIAGNOSIS — I82409 Acute embolism and thrombosis of unspecified deep veins of unspecified lower extremity: Secondary | ICD-10-CM | POA: Diagnosis not present

## 2013-05-06 DIAGNOSIS — I82409 Acute embolism and thrombosis of unspecified deep veins of unspecified lower extremity: Secondary | ICD-10-CM | POA: Diagnosis not present

## 2013-05-13 ENCOUNTER — Telehealth: Payer: Self-pay | Admitting: Neurology

## 2013-05-13 DIAGNOSIS — I82409 Acute embolism and thrombosis of unspecified deep veins of unspecified lower extremity: Secondary | ICD-10-CM | POA: Diagnosis not present

## 2013-05-13 NOTE — Telephone Encounter (Signed)
Last OV says to continue Gabapentin, and does not mention any new medications.  I called the patient back at home, got no answer, no VM.  Called cell, he was not available.  Left message.

## 2013-05-13 NOTE — Telephone Encounter (Signed)
Patient states that Dr. Janann Colonel said he would call in a script to his pharmacy for back pain, patient does not know what it is called. Patient states that he has been to his pharmacy several times already and they don't have anything for him.

## 2013-05-14 NOTE — Telephone Encounter (Signed)
I have not been able to reach this patient.  Will have to wait fir a return call.

## 2013-06-13 DIAGNOSIS — I82409 Acute embolism and thrombosis of unspecified deep veins of unspecified lower extremity: Secondary | ICD-10-CM | POA: Diagnosis not present

## 2013-07-01 DIAGNOSIS — I82409 Acute embolism and thrombosis of unspecified deep veins of unspecified lower extremity: Secondary | ICD-10-CM | POA: Diagnosis not present

## 2013-07-01 DIAGNOSIS — M1990 Unspecified osteoarthritis, unspecified site: Secondary | ICD-10-CM | POA: Diagnosis not present

## 2013-07-01 DIAGNOSIS — E782 Mixed hyperlipidemia: Secondary | ICD-10-CM | POA: Diagnosis not present

## 2013-07-01 DIAGNOSIS — I10 Essential (primary) hypertension: Secondary | ICD-10-CM | POA: Diagnosis not present

## 2013-07-31 DIAGNOSIS — I82409 Acute embolism and thrombosis of unspecified deep veins of unspecified lower extremity: Secondary | ICD-10-CM | POA: Diagnosis not present

## 2013-09-03 DIAGNOSIS — I82409 Acute embolism and thrombosis of unspecified deep veins of unspecified lower extremity: Secondary | ICD-10-CM | POA: Diagnosis not present

## 2013-09-19 DIAGNOSIS — I82409 Acute embolism and thrombosis of unspecified deep veins of unspecified lower extremity: Secondary | ICD-10-CM | POA: Diagnosis not present

## 2013-10-10 ENCOUNTER — Ambulatory Visit: Payer: Medicare Other | Admitting: Neurology

## 2013-10-17 ENCOUNTER — Encounter: Payer: Self-pay | Admitting: Neurology

## 2013-11-20 DIAGNOSIS — I10 Essential (primary) hypertension: Secondary | ICD-10-CM | POA: Diagnosis not present

## 2013-11-20 DIAGNOSIS — E782 Mixed hyperlipidemia: Secondary | ICD-10-CM | POA: Diagnosis not present

## 2013-11-21 DIAGNOSIS — I8290 Acute embolism and thrombosis of unspecified vein: Secondary | ICD-10-CM | POA: Diagnosis not present

## 2013-11-21 DIAGNOSIS — E782 Mixed hyperlipidemia: Secondary | ICD-10-CM | POA: Diagnosis not present

## 2013-12-05 DIAGNOSIS — I8290 Acute embolism and thrombosis of unspecified vein: Secondary | ICD-10-CM | POA: Diagnosis not present

## 2013-12-12 DIAGNOSIS — I10 Essential (primary) hypertension: Secondary | ICD-10-CM | POA: Diagnosis not present

## 2013-12-12 DIAGNOSIS — M1991 Primary osteoarthritis, unspecified site: Secondary | ICD-10-CM | POA: Diagnosis not present

## 2013-12-12 DIAGNOSIS — E782 Mixed hyperlipidemia: Secondary | ICD-10-CM | POA: Diagnosis not present

## 2013-12-12 DIAGNOSIS — I8290 Acute embolism and thrombosis of unspecified vein: Secondary | ICD-10-CM | POA: Diagnosis not present

## 2014-01-06 ENCOUNTER — Other Ambulatory Visit: Payer: Self-pay | Admitting: Neurology

## 2014-01-06 NOTE — Telephone Encounter (Signed)
Former Occupational psychologist patient assigned to Dr Jannifer Franklin

## 2014-01-06 NOTE — Telephone Encounter (Signed)
Last OV note says: I would like to suggest continuing you on gabapentin 300mg  three times a day.

## 2014-01-06 NOTE — Addendum Note (Signed)
Addended by: Norva Pavlov C on: 01/06/2014 06:45 PM   Modules accepted: Orders, Medications

## 2014-01-13 DIAGNOSIS — I8290 Acute embolism and thrombosis of unspecified vein: Secondary | ICD-10-CM | POA: Diagnosis not present

## 2014-01-27 DIAGNOSIS — I8290 Acute embolism and thrombosis of unspecified vein: Secondary | ICD-10-CM | POA: Diagnosis not present

## 2014-03-03 DIAGNOSIS — I8290 Acute embolism and thrombosis of unspecified vein: Secondary | ICD-10-CM | POA: Diagnosis not present

## 2014-03-18 ENCOUNTER — Encounter: Payer: Self-pay | Admitting: Neurology

## 2014-03-18 ENCOUNTER — Ambulatory Visit (INDEPENDENT_AMBULATORY_CARE_PROVIDER_SITE_OTHER): Payer: Medicare Other | Admitting: Neurology

## 2014-03-18 VITALS — BP 133/77 | HR 75 | Ht 67.0 in | Wt 174.8 lb

## 2014-03-18 DIAGNOSIS — G8929 Other chronic pain: Secondary | ICD-10-CM | POA: Diagnosis not present

## 2014-03-18 DIAGNOSIS — M545 Low back pain, unspecified: Secondary | ICD-10-CM | POA: Insufficient documentation

## 2014-03-18 HISTORY — DX: Other chronic pain: G89.29

## 2014-03-18 MED ORDER — GABAPENTIN 300 MG PO CAPS: 300.0000 mg | ORAL_CAPSULE | Freq: Three times a day (TID) | ORAL | Status: DC

## 2014-03-18 NOTE — Patient Instructions (Signed)

## 2014-03-18 NOTE — Progress Notes (Signed)
Reason for visit: Back pain  Robert Richards is an 74 y.o. male  History of present illness:  Robert Richards is a 74 year old right-handed black male with a history of chronic low back pain. He has done very well since last seen, he indicates that he has refrained from lifting heavy objects, and his back is feeling much better. He has never had pain radiating down from the back into the legs, the back pain is always been centered in the back itself. He is on gabapentin that he takes only as needed, not daily. He returns for an evaluation. No other significant medical issues have come up since last seen.  Past Medical History  Diagnosis Date  . Hypertension   . Hyperlipidemia   . Coronary artery disease     NONOBSTRUCTIVE  . DVT (deep venous thrombosis)   . Aneurysmal dilatation     OF RAMUS INTERRMEDIUS  . Chronic low back pain 03/18/2014    Past Surgical History  Procedure Laterality Date  . Cardiac catheterization  10/2007  . Shoulder surgery Left   . Hernia repair Bilateral     Family History  Problem Relation Age of Onset  . Heart attack Father   . Diabetes Mother   . Heart Problems Mother   . Gout Mother     Social history:  reports that he has quit smoking. His smoking use included Cigarettes. He has a 5 pack-year smoking history. He has never used smokeless tobacco. He reports that he drinks about 0.6 oz of alcohol per week. He reports that he does not use illicit drugs.   No Known Allergies  Medications:  Current Outpatient Prescriptions on File Prior to Visit  Medication Sig Dispense Refill  . amLODipine (NORVASC) 10 MG tablet Take 10 mg by mouth at bedtime.     . diazepam (VALIUM) 5 MG tablet Take 1 or 2 po Q 6hrs for pain 25 tablet 0  . HYDROcodone-acetaminophen (NORCO/VICODIN) 5-325 MG per tablet Take 1 or 2 po Q 6hrs for pain 12 tablet 0  . metoprolol (LOPRESSOR) 50 MG tablet     . warfarin (COUMADIN) 2.5 MG tablet Take 2.5 mg by mouth every evening.      No current facility-administered medications on file prior to visit.    ROS:  Out of a complete 14 system review of symptoms, the patient complains only of the following symptoms, and all other reviewed systems are negative.  Hearing loss, ringing in the ears, runny nose Loss of vision Snoring Joint pain, back pain, neck pain  Blood pressure 133/77, pulse 75, height 5\' 7"  (1.702 m), weight 174 lb 12.8 oz (79.289 kg).  Physical Exam  General: The patient is alert and cooperative at the time of the examination.  Neuromuscular: Range of movement of low back was normal or near-normal.  Skin: No significant peripheral edema is noted.   Neurologic Exam  Mental status: The patient is oriented x 3.  Cranial nerves: Facial symmetry is present. Speech is normal, no aphasia or dysarthria is noted. Extraocular movements are full. Visual fields are full.  Motor: The patient has good strength in all 4 extremities.  Sensory examination: Soft touch sensation is symmetric on the face, arms, and legs.  Coordination: The patient has good finger-nose-finger and heel-to-shin bilaterally.  Gait and station: The patient has a normal gait. Tandem gait is normal. Romberg is negative. No drift is seen.  Reflexes: Deep tendon reflexes are symmetric.   Assessment/Plan:  1.  Chronic low back pain  The patient has well-controlled low back pain. He is refraining from activities that exacerbate his low back issues. He will take gabapentin on as-needed basis. He will follow-up through this office in one year. A prescription for the gabapentin was called in.  Jill Alexanders MD 03/18/2014 6:52 PM  [facility] [address] [place], [place] [address]  Phone [phone] Fax [phone]

## 2014-03-21 DIAGNOSIS — I8290 Acute embolism and thrombosis of unspecified vein: Secondary | ICD-10-CM | POA: Diagnosis not present

## 2014-04-04 DIAGNOSIS — I8290 Acute embolism and thrombosis of unspecified vein: Secondary | ICD-10-CM | POA: Diagnosis not present

## 2014-05-06 DIAGNOSIS — I8290 Acute embolism and thrombosis of unspecified vein: Secondary | ICD-10-CM | POA: Diagnosis not present

## 2014-06-03 DIAGNOSIS — I8290 Acute embolism and thrombosis of unspecified vein: Secondary | ICD-10-CM | POA: Diagnosis not present

## 2014-06-16 DIAGNOSIS — Z86010 Personal history of colonic polyps: Secondary | ICD-10-CM | POA: Diagnosis not present

## 2014-06-20 DIAGNOSIS — I10 Essential (primary) hypertension: Secondary | ICD-10-CM | POA: Diagnosis not present

## 2014-06-20 DIAGNOSIS — K648 Other hemorrhoids: Secondary | ICD-10-CM | POA: Diagnosis not present

## 2014-06-20 DIAGNOSIS — Z1211 Encounter for screening for malignant neoplasm of colon: Secondary | ICD-10-CM | POA: Diagnosis not present

## 2014-06-20 DIAGNOSIS — D123 Benign neoplasm of transverse colon: Secondary | ICD-10-CM | POA: Diagnosis not present

## 2014-06-20 DIAGNOSIS — H9190 Unspecified hearing loss, unspecified ear: Secondary | ICD-10-CM | POA: Diagnosis not present

## 2014-06-20 DIAGNOSIS — Z86718 Personal history of other venous thrombosis and embolism: Secondary | ICD-10-CM | POA: Diagnosis not present

## 2014-06-20 DIAGNOSIS — I2510 Atherosclerotic heart disease of native coronary artery without angina pectoris: Secondary | ICD-10-CM | POA: Diagnosis not present

## 2014-06-20 DIAGNOSIS — E785 Hyperlipidemia, unspecified: Secondary | ICD-10-CM | POA: Diagnosis not present

## 2014-06-20 DIAGNOSIS — D124 Benign neoplasm of descending colon: Secondary | ICD-10-CM | POA: Diagnosis not present

## 2014-06-20 DIAGNOSIS — I252 Old myocardial infarction: Secondary | ICD-10-CM | POA: Diagnosis not present

## 2014-06-20 DIAGNOSIS — M1990 Unspecified osteoarthritis, unspecified site: Secondary | ICD-10-CM | POA: Diagnosis not present

## 2014-06-20 DIAGNOSIS — K5730 Diverticulosis of large intestine without perforation or abscess without bleeding: Secondary | ICD-10-CM | POA: Diagnosis not present

## 2014-06-20 DIAGNOSIS — Z7901 Long term (current) use of anticoagulants: Secondary | ICD-10-CM | POA: Diagnosis not present

## 2014-06-20 DIAGNOSIS — K219 Gastro-esophageal reflux disease without esophagitis: Secondary | ICD-10-CM | POA: Diagnosis not present

## 2014-06-20 DIAGNOSIS — Z79899 Other long term (current) drug therapy: Secondary | ICD-10-CM | POA: Diagnosis not present

## 2014-06-20 DIAGNOSIS — Z86010 Personal history of colonic polyps: Secondary | ICD-10-CM | POA: Diagnosis not present

## 2014-06-20 DIAGNOSIS — Z8249 Family history of ischemic heart disease and other diseases of the circulatory system: Secondary | ICD-10-CM | POA: Diagnosis not present

## 2014-06-20 DIAGNOSIS — F17210 Nicotine dependence, cigarettes, uncomplicated: Secondary | ICD-10-CM | POA: Diagnosis not present

## 2014-06-24 DIAGNOSIS — D123 Benign neoplasm of transverse colon: Secondary | ICD-10-CM | POA: Diagnosis not present

## 2014-06-24 DIAGNOSIS — D124 Benign neoplasm of descending colon: Secondary | ICD-10-CM | POA: Diagnosis not present

## 2014-07-04 DIAGNOSIS — I8290 Acute embolism and thrombosis of unspecified vein: Secondary | ICD-10-CM | POA: Diagnosis not present

## 2014-07-11 DIAGNOSIS — I8290 Acute embolism and thrombosis of unspecified vein: Secondary | ICD-10-CM | POA: Diagnosis not present

## 2014-08-08 DIAGNOSIS — I8290 Acute embolism and thrombosis of unspecified vein: Secondary | ICD-10-CM | POA: Diagnosis not present

## 2014-09-17 DIAGNOSIS — I10 Essential (primary) hypertension: Secondary | ICD-10-CM | POA: Diagnosis not present

## 2014-09-17 DIAGNOSIS — I8290 Acute embolism and thrombosis of unspecified vein: Secondary | ICD-10-CM | POA: Diagnosis not present

## 2014-09-17 DIAGNOSIS — M1991 Primary osteoarthritis, unspecified site: Secondary | ICD-10-CM | POA: Diagnosis not present

## 2014-09-17 DIAGNOSIS — E782 Mixed hyperlipidemia: Secondary | ICD-10-CM | POA: Diagnosis not present

## 2014-10-14 DIAGNOSIS — I8290 Acute embolism and thrombosis of unspecified vein: Secondary | ICD-10-CM | POA: Diagnosis not present

## 2014-11-11 DIAGNOSIS — I8290 Acute embolism and thrombosis of unspecified vein: Secondary | ICD-10-CM | POA: Diagnosis not present

## 2014-11-17 DIAGNOSIS — I8290 Acute embolism and thrombosis of unspecified vein: Secondary | ICD-10-CM | POA: Diagnosis not present

## 2015-01-06 DIAGNOSIS — I8290 Acute embolism and thrombosis of unspecified vein: Secondary | ICD-10-CM | POA: Diagnosis not present

## 2015-01-22 DIAGNOSIS — I8290 Acute embolism and thrombosis of unspecified vein: Secondary | ICD-10-CM | POA: Diagnosis not present

## 2015-01-22 DIAGNOSIS — M1991 Primary osteoarthritis, unspecified site: Secondary | ICD-10-CM | POA: Diagnosis not present

## 2015-01-22 DIAGNOSIS — I10 Essential (primary) hypertension: Secondary | ICD-10-CM | POA: Diagnosis not present

## 2015-01-22 DIAGNOSIS — E782 Mixed hyperlipidemia: Secondary | ICD-10-CM | POA: Diagnosis not present

## 2015-01-28 DIAGNOSIS — R197 Diarrhea, unspecified: Secondary | ICD-10-CM | POA: Diagnosis not present

## 2015-02-19 DIAGNOSIS — I8290 Acute embolism and thrombosis of unspecified vein: Secondary | ICD-10-CM | POA: Diagnosis not present

## 2015-03-05 DIAGNOSIS — I8290 Acute embolism and thrombosis of unspecified vein: Secondary | ICD-10-CM | POA: Diagnosis not present

## 2015-03-19 ENCOUNTER — Ambulatory Visit: Payer: Medicare Other | Admitting: Adult Health

## 2015-03-23 ENCOUNTER — Encounter: Payer: Self-pay | Admitting: Adult Health

## 2015-03-31 DIAGNOSIS — I8290 Acute embolism and thrombosis of unspecified vein: Secondary | ICD-10-CM | POA: Diagnosis not present

## 2015-04-14 DIAGNOSIS — I8290 Acute embolism and thrombosis of unspecified vein: Secondary | ICD-10-CM | POA: Diagnosis not present

## 2015-04-28 DIAGNOSIS — I8290 Acute embolism and thrombosis of unspecified vein: Secondary | ICD-10-CM | POA: Diagnosis not present

## 2015-05-05 DIAGNOSIS — I8290 Acute embolism and thrombosis of unspecified vein: Secondary | ICD-10-CM | POA: Diagnosis not present

## 2015-06-04 DIAGNOSIS — I8290 Acute embolism and thrombosis of unspecified vein: Secondary | ICD-10-CM | POA: Diagnosis not present

## 2015-07-08 DIAGNOSIS — I8290 Acute embolism and thrombosis of unspecified vein: Secondary | ICD-10-CM | POA: Diagnosis not present

## 2015-07-22 DIAGNOSIS — I8290 Acute embolism and thrombosis of unspecified vein: Secondary | ICD-10-CM | POA: Diagnosis not present

## 2015-08-06 DIAGNOSIS — E782 Mixed hyperlipidemia: Secondary | ICD-10-CM | POA: Diagnosis not present

## 2015-08-06 DIAGNOSIS — I8290 Acute embolism and thrombosis of unspecified vein: Secondary | ICD-10-CM | POA: Diagnosis not present

## 2015-08-06 DIAGNOSIS — M545 Low back pain: Secondary | ICD-10-CM | POA: Diagnosis not present

## 2015-08-06 DIAGNOSIS — M1991 Primary osteoarthritis, unspecified site: Secondary | ICD-10-CM | POA: Diagnosis not present

## 2015-08-06 DIAGNOSIS — I10 Essential (primary) hypertension: Secondary | ICD-10-CM | POA: Diagnosis not present

## 2015-10-09 DIAGNOSIS — I8290 Acute embolism and thrombosis of unspecified vein: Secondary | ICD-10-CM | POA: Diagnosis not present

## 2015-11-12 DIAGNOSIS — I8290 Acute embolism and thrombosis of unspecified vein: Secondary | ICD-10-CM | POA: Diagnosis not present

## 2015-11-26 DIAGNOSIS — I8290 Acute embolism and thrombosis of unspecified vein: Secondary | ICD-10-CM | POA: Diagnosis not present

## 2016-01-11 DIAGNOSIS — I8290 Acute embolism and thrombosis of unspecified vein: Secondary | ICD-10-CM | POA: Diagnosis not present

## 2016-02-25 DIAGNOSIS — I8290 Acute embolism and thrombosis of unspecified vein: Secondary | ICD-10-CM | POA: Diagnosis not present

## 2016-03-15 DIAGNOSIS — M25562 Pain in left knee: Secondary | ICD-10-CM | POA: Diagnosis not present

## 2016-03-15 DIAGNOSIS — M25552 Pain in left hip: Secondary | ICD-10-CM | POA: Diagnosis not present

## 2016-03-15 DIAGNOSIS — M1991 Primary osteoarthritis, unspecified site: Secondary | ICD-10-CM | POA: Diagnosis not present

## 2016-03-15 DIAGNOSIS — E782 Mixed hyperlipidemia: Secondary | ICD-10-CM | POA: Diagnosis not present

## 2016-04-01 DIAGNOSIS — I8290 Acute embolism and thrombosis of unspecified vein: Secondary | ICD-10-CM | POA: Diagnosis not present

## 2016-05-03 DIAGNOSIS — I8290 Acute embolism and thrombosis of unspecified vein: Secondary | ICD-10-CM | POA: Diagnosis not present

## 2016-05-09 DIAGNOSIS — M79642 Pain in left hand: Secondary | ICD-10-CM | POA: Diagnosis not present

## 2016-05-09 DIAGNOSIS — M25552 Pain in left hip: Secondary | ICD-10-CM | POA: Diagnosis not present

## 2016-05-17 DIAGNOSIS — M1612 Unilateral primary osteoarthritis, left hip: Secondary | ICD-10-CM | POA: Diagnosis not present

## 2016-06-02 DIAGNOSIS — I8290 Acute embolism and thrombosis of unspecified vein: Secondary | ICD-10-CM | POA: Diagnosis not present

## 2016-06-02 DIAGNOSIS — M1612 Unilateral primary osteoarthritis, left hip: Secondary | ICD-10-CM | POA: Diagnosis not present

## 2016-06-06 DIAGNOSIS — I8290 Acute embolism and thrombosis of unspecified vein: Secondary | ICD-10-CM | POA: Diagnosis not present

## 2016-06-22 DIAGNOSIS — I8290 Acute embolism and thrombosis of unspecified vein: Secondary | ICD-10-CM | POA: Diagnosis not present

## 2016-06-22 DIAGNOSIS — Z0001 Encounter for general adult medical examination with abnormal findings: Secondary | ICD-10-CM | POA: Diagnosis not present

## 2016-06-22 DIAGNOSIS — Z6825 Body mass index (BMI) 25.0-25.9, adult: Secondary | ICD-10-CM | POA: Diagnosis not present

## 2016-06-22 DIAGNOSIS — M25552 Pain in left hip: Secondary | ICD-10-CM | POA: Diagnosis not present

## 2016-07-25 DIAGNOSIS — I8290 Acute embolism and thrombosis of unspecified vein: Secondary | ICD-10-CM | POA: Diagnosis not present

## 2016-08-10 DIAGNOSIS — I8290 Acute embolism and thrombosis of unspecified vein: Secondary | ICD-10-CM | POA: Diagnosis not present

## 2016-08-15 DIAGNOSIS — J309 Allergic rhinitis, unspecified: Secondary | ICD-10-CM | POA: Diagnosis not present

## 2016-08-15 DIAGNOSIS — M25552 Pain in left hip: Secondary | ICD-10-CM | POA: Diagnosis not present

## 2016-08-15 DIAGNOSIS — Z6825 Body mass index (BMI) 25.0-25.9, adult: Secondary | ICD-10-CM | POA: Diagnosis not present

## 2016-09-14 DIAGNOSIS — I8290 Acute embolism and thrombosis of unspecified vein: Secondary | ICD-10-CM | POA: Diagnosis not present

## 2016-09-16 DIAGNOSIS — I8290 Acute embolism and thrombosis of unspecified vein: Secondary | ICD-10-CM | POA: Diagnosis not present

## 2016-09-19 DIAGNOSIS — I8290 Acute embolism and thrombosis of unspecified vein: Secondary | ICD-10-CM | POA: Diagnosis not present

## 2016-09-26 DIAGNOSIS — I8290 Acute embolism and thrombosis of unspecified vein: Secondary | ICD-10-CM | POA: Diagnosis not present

## 2016-10-04 DIAGNOSIS — I8290 Acute embolism and thrombosis of unspecified vein: Secondary | ICD-10-CM | POA: Diagnosis not present

## 2016-10-11 DIAGNOSIS — I8290 Acute embolism and thrombosis of unspecified vein: Secondary | ICD-10-CM | POA: Diagnosis not present

## 2016-11-02 DIAGNOSIS — I8290 Acute embolism and thrombosis of unspecified vein: Secondary | ICD-10-CM | POA: Diagnosis not present

## 2016-11-15 DIAGNOSIS — I8290 Acute embolism and thrombosis of unspecified vein: Secondary | ICD-10-CM | POA: Diagnosis not present

## 2016-12-16 DIAGNOSIS — I8290 Acute embolism and thrombosis of unspecified vein: Secondary | ICD-10-CM | POA: Diagnosis not present

## 2016-12-26 DIAGNOSIS — I8290 Acute embolism and thrombosis of unspecified vein: Secondary | ICD-10-CM | POA: Diagnosis not present

## 2017-01-16 DIAGNOSIS — I8290 Acute embolism and thrombosis of unspecified vein: Secondary | ICD-10-CM | POA: Diagnosis not present

## 2017-01-18 DIAGNOSIS — I8290 Acute embolism and thrombosis of unspecified vein: Secondary | ICD-10-CM | POA: Diagnosis not present

## 2017-01-26 DIAGNOSIS — I8290 Acute embolism and thrombosis of unspecified vein: Secondary | ICD-10-CM | POA: Diagnosis not present

## 2017-02-08 DIAGNOSIS — I8290 Acute embolism and thrombosis of unspecified vein: Secondary | ICD-10-CM | POA: Diagnosis not present

## 2017-03-01 DIAGNOSIS — I8290 Acute embolism and thrombosis of unspecified vein: Secondary | ICD-10-CM | POA: Diagnosis not present

## 2017-03-10 DIAGNOSIS — R1084 Generalized abdominal pain: Secondary | ICD-10-CM | POA: Diagnosis not present

## 2017-03-10 DIAGNOSIS — Z6827 Body mass index (BMI) 27.0-27.9, adult: Secondary | ICD-10-CM | POA: Diagnosis not present

## 2017-03-10 DIAGNOSIS — R350 Frequency of micturition: Secondary | ICD-10-CM | POA: Diagnosis not present

## 2017-03-15 DIAGNOSIS — I8290 Acute embolism and thrombosis of unspecified vein: Secondary | ICD-10-CM | POA: Diagnosis not present

## 2017-03-22 DIAGNOSIS — I8290 Acute embolism and thrombosis of unspecified vein: Secondary | ICD-10-CM | POA: Diagnosis not present

## 2017-04-19 DIAGNOSIS — I8290 Acute embolism and thrombosis of unspecified vein: Secondary | ICD-10-CM | POA: Diagnosis not present

## 2017-04-26 DIAGNOSIS — I8290 Acute embolism and thrombosis of unspecified vein: Secondary | ICD-10-CM | POA: Diagnosis not present

## 2017-05-23 DIAGNOSIS — M545 Low back pain: Secondary | ICD-10-CM | POA: Diagnosis not present

## 2017-05-23 DIAGNOSIS — Z6826 Body mass index (BMI) 26.0-26.9, adult: Secondary | ICD-10-CM | POA: Diagnosis not present

## 2017-05-23 DIAGNOSIS — M25552 Pain in left hip: Secondary | ICD-10-CM | POA: Diagnosis not present

## 2017-05-23 DIAGNOSIS — R1084 Generalized abdominal pain: Secondary | ICD-10-CM | POA: Diagnosis not present

## 2017-05-23 DIAGNOSIS — I8290 Acute embolism and thrombosis of unspecified vein: Secondary | ICD-10-CM | POA: Diagnosis not present

## 2017-05-23 DIAGNOSIS — R350 Frequency of micturition: Secondary | ICD-10-CM | POA: Diagnosis not present

## 2017-05-26 DIAGNOSIS — R1084 Generalized abdominal pain: Secondary | ICD-10-CM | POA: Diagnosis not present

## 2017-05-26 DIAGNOSIS — I77811 Abdominal aortic ectasia: Secondary | ICD-10-CM | POA: Diagnosis not present

## 2017-05-26 DIAGNOSIS — N281 Cyst of kidney, acquired: Secondary | ICD-10-CM | POA: Diagnosis not present

## 2017-06-05 DIAGNOSIS — Z0000 Encounter for general adult medical examination without abnormal findings: Secondary | ICD-10-CM | POA: Diagnosis not present

## 2017-06-05 DIAGNOSIS — I10 Essential (primary) hypertension: Secondary | ICD-10-CM | POA: Diagnosis not present

## 2017-06-29 DIAGNOSIS — I8290 Acute embolism and thrombosis of unspecified vein: Secondary | ICD-10-CM | POA: Diagnosis not present

## 2017-07-06 DIAGNOSIS — I8290 Acute embolism and thrombosis of unspecified vein: Secondary | ICD-10-CM | POA: Diagnosis not present

## 2017-07-19 DIAGNOSIS — I8290 Acute embolism and thrombosis of unspecified vein: Secondary | ICD-10-CM | POA: Diagnosis not present

## 2017-08-02 DIAGNOSIS — Z0001 Encounter for general adult medical examination with abnormal findings: Secondary | ICD-10-CM | POA: Diagnosis not present

## 2017-08-02 DIAGNOSIS — I8290 Acute embolism and thrombosis of unspecified vein: Secondary | ICD-10-CM | POA: Diagnosis not present

## 2017-08-23 DIAGNOSIS — I8290 Acute embolism and thrombosis of unspecified vein: Secondary | ICD-10-CM | POA: Diagnosis not present

## 2017-08-30 ENCOUNTER — Other Ambulatory Visit: Payer: Self-pay

## 2017-09-25 DIAGNOSIS — I8290 Acute embolism and thrombosis of unspecified vein: Secondary | ICD-10-CM | POA: Diagnosis not present

## 2017-10-09 DIAGNOSIS — I8290 Acute embolism and thrombosis of unspecified vein: Secondary | ICD-10-CM | POA: Diagnosis not present

## 2017-11-01 DIAGNOSIS — K625 Hemorrhage of anus and rectum: Secondary | ICD-10-CM | POA: Diagnosis not present

## 2017-11-01 DIAGNOSIS — R3911 Hesitancy of micturition: Secondary | ICD-10-CM | POA: Diagnosis not present

## 2017-11-01 DIAGNOSIS — I8290 Acute embolism and thrombosis of unspecified vein: Secondary | ICD-10-CM | POA: Diagnosis not present

## 2017-11-01 DIAGNOSIS — Z6826 Body mass index (BMI) 26.0-26.9, adult: Secondary | ICD-10-CM | POA: Diagnosis not present

## 2017-11-16 DIAGNOSIS — I8290 Acute embolism and thrombosis of unspecified vein: Secondary | ICD-10-CM | POA: Diagnosis not present

## 2017-11-30 DIAGNOSIS — I8290 Acute embolism and thrombosis of unspecified vein: Secondary | ICD-10-CM | POA: Diagnosis not present

## 2017-12-14 DIAGNOSIS — I8290 Acute embolism and thrombosis of unspecified vein: Secondary | ICD-10-CM | POA: Diagnosis not present

## 2017-12-26 ENCOUNTER — Ambulatory Visit (INDEPENDENT_AMBULATORY_CARE_PROVIDER_SITE_OTHER): Payer: Medicare Other | Admitting: Urology

## 2017-12-26 ENCOUNTER — Other Ambulatory Visit (HOSPITAL_COMMUNITY)
Admission: RE | Admit: 2017-12-26 | Discharge: 2017-12-26 | Disposition: A | Discharge status: Home / Self Care | Payer: Medicare Other | Source: Ambulatory Visit | Attending: Urology | Admitting: Urology

## 2017-12-26 DIAGNOSIS — R351 Nocturia: Secondary | ICD-10-CM

## 2017-12-26 DIAGNOSIS — N401 Enlarged prostate with lower urinary tract symptoms: Secondary | ICD-10-CM | POA: Diagnosis not present

## 2017-12-26 DIAGNOSIS — R972 Elevated prostate specific antigen [PSA]: Secondary | ICD-10-CM | POA: Diagnosis not present

## 2017-12-26 DIAGNOSIS — R3121 Asymptomatic microscopic hematuria: Secondary | ICD-10-CM

## 2017-12-26 LAB — URINALYSIS, COMPLETE (UACMP) WITH MICROSCOPIC
Bacteria, UA: NONE SEEN
Bilirubin Urine: NEGATIVE
GLUCOSE, UA: NEGATIVE mg/dL
KETONES UR: NEGATIVE mg/dL
Leukocytes, UA: NEGATIVE
NITRITE: NEGATIVE
PH: 5 (ref 5.0–8.0)
Protein, ur: NEGATIVE mg/dL
SPECIFIC GRAVITY, URINE: 1.016 (ref 1.005–1.030)

## 2018-01-03 DIAGNOSIS — I8290 Acute embolism and thrombosis of unspecified vein: Secondary | ICD-10-CM | POA: Diagnosis not present

## 2018-01-08 DIAGNOSIS — R972 Elevated prostate specific antigen [PSA]: Secondary | ICD-10-CM | POA: Diagnosis not present

## 2018-01-17 DIAGNOSIS — I8290 Acute embolism and thrombosis of unspecified vein: Secondary | ICD-10-CM | POA: Diagnosis not present

## 2018-02-02 DIAGNOSIS — I8290 Acute embolism and thrombosis of unspecified vein: Secondary | ICD-10-CM | POA: Diagnosis not present

## 2018-02-27 ENCOUNTER — Ambulatory Visit: Payer: Medicare Other | Admitting: Urology

## 2018-03-05 DIAGNOSIS — I8290 Acute embolism and thrombosis of unspecified vein: Secondary | ICD-10-CM | POA: Diagnosis not present

## 2018-04-06 DIAGNOSIS — I8290 Acute embolism and thrombosis of unspecified vein: Secondary | ICD-10-CM | POA: Diagnosis not present

## 2018-04-10 ENCOUNTER — Ambulatory Visit (INDEPENDENT_AMBULATORY_CARE_PROVIDER_SITE_OTHER): Payer: Medicare HMO | Admitting: Urology

## 2018-04-10 DIAGNOSIS — R972 Elevated prostate specific antigen [PSA]: Secondary | ICD-10-CM | POA: Diagnosis not present

## 2018-04-10 DIAGNOSIS — N401 Enlarged prostate with lower urinary tract symptoms: Secondary | ICD-10-CM

## 2018-04-10 DIAGNOSIS — R351 Nocturia: Secondary | ICD-10-CM

## 2018-04-26 DIAGNOSIS — I8290 Acute embolism and thrombosis of unspecified vein: Secondary | ICD-10-CM | POA: Diagnosis not present

## 2018-05-17 DIAGNOSIS — I8290 Acute embolism and thrombosis of unspecified vein: Secondary | ICD-10-CM | POA: Diagnosis not present

## 2018-05-31 DIAGNOSIS — I8290 Acute embolism and thrombosis of unspecified vein: Secondary | ICD-10-CM | POA: Diagnosis not present

## 2018-06-18 DIAGNOSIS — Z86718 Personal history of other venous thrombosis and embolism: Secondary | ICD-10-CM | POA: Diagnosis not present

## 2018-07-03 ENCOUNTER — Other Ambulatory Visit: Payer: Self-pay

## 2018-07-03 ENCOUNTER — Other Ambulatory Visit (HOSPITAL_COMMUNITY): Payer: Self-pay | Admitting: Urology

## 2018-07-03 ENCOUNTER — Ambulatory Visit (HOSPITAL_COMMUNITY)
Admission: RE | Admit: 2018-07-03 | Discharge: 2018-07-03 | Disposition: A | Discharge status: Home / Self Care | Payer: Medicare HMO | Source: Ambulatory Visit | Attending: Urology | Admitting: Urology

## 2018-07-03 DIAGNOSIS — R972 Elevated prostate specific antigen [PSA]: Secondary | ICD-10-CM | POA: Diagnosis not present

## 2018-07-03 DIAGNOSIS — C61 Malignant neoplasm of prostate: Secondary | ICD-10-CM | POA: Diagnosis not present

## 2018-07-03 MED ORDER — GENTAMICIN SULFATE 40 MG/ML IJ SOLN
160.0000 mg | Freq: Once | INTRAMUSCULAR | Status: AC
  Administered 2018-07-03: 12:00:00 160 mg via INTRAMUSCULAR

## 2018-07-03 MED ORDER — LIDOCAINE HCL (PF) 2 % IJ SOLN
INTRAMUSCULAR | Status: AC
  Filled 2018-07-03: qty 10

## 2018-07-03 MED ORDER — GENTAMICIN SULFATE 40 MG/ML IJ SOLN
INTRAMUSCULAR | Status: AC
  Filled 2018-07-03: qty 4

## 2018-07-06 ENCOUNTER — Other Ambulatory Visit: Payer: Self-pay | Admitting: Urology

## 2018-07-06 ENCOUNTER — Other Ambulatory Visit (HOSPITAL_COMMUNITY): Payer: Self-pay | Admitting: Urology

## 2018-07-06 DIAGNOSIS — C61 Malignant neoplasm of prostate: Secondary | ICD-10-CM

## 2018-07-19 DIAGNOSIS — M79605 Pain in left leg: Secondary | ICD-10-CM | POA: Diagnosis not present

## 2018-07-19 DIAGNOSIS — Z6824 Body mass index (BMI) 24.0-24.9, adult: Secondary | ICD-10-CM | POA: Diagnosis not present

## 2018-07-19 DIAGNOSIS — Z86718 Personal history of other venous thrombosis and embolism: Secondary | ICD-10-CM | POA: Diagnosis not present

## 2018-07-19 DIAGNOSIS — R600 Localized edema: Secondary | ICD-10-CM | POA: Diagnosis not present

## 2018-07-30 ENCOUNTER — Other Ambulatory Visit: Payer: Self-pay

## 2018-07-30 ENCOUNTER — Ambulatory Visit (HOSPITAL_COMMUNITY)
Admission: RE | Admit: 2018-07-30 | Discharge: 2018-07-30 | Disposition: A | Discharge status: Home / Self Care | Payer: Medicare HMO | Source: Ambulatory Visit | Attending: Urology | Admitting: Urology

## 2018-07-30 DIAGNOSIS — N281 Cyst of kidney, acquired: Secondary | ICD-10-CM | POA: Diagnosis not present

## 2018-07-30 DIAGNOSIS — R972 Elevated prostate specific antigen [PSA]: Secondary | ICD-10-CM | POA: Diagnosis not present

## 2018-07-30 DIAGNOSIS — C61 Malignant neoplasm of prostate: Secondary | ICD-10-CM | POA: Diagnosis not present

## 2018-07-30 LAB — POCT I-STAT CREATININE: Creatinine, Ser: 1.1 mg/dL (ref 0.61–1.24)

## 2018-07-30 MED ORDER — IOHEXOL 300 MG/ML  SOLN: 75.0000 mL | Freq: Once | INTRAMUSCULAR | Status: DC | PRN

## 2018-07-30 MED ORDER — IOHEXOL 300 MG/ML  SOLN
100.0000 mL | Freq: Once | INTRAMUSCULAR | Status: AC | PRN
  Administered 2018-07-30: 10:00:00 100 mL via INTRAVENOUS

## 2018-08-13 DIAGNOSIS — R0789 Other chest pain: Secondary | ICD-10-CM | POA: Diagnosis not present

## 2018-08-13 DIAGNOSIS — R7989 Other specified abnormal findings of blood chemistry: Secondary | ICD-10-CM | POA: Diagnosis not present

## 2018-08-13 DIAGNOSIS — I10 Essential (primary) hypertension: Secondary | ICD-10-CM | POA: Diagnosis not present

## 2018-08-13 DIAGNOSIS — F329 Major depressive disorder, single episode, unspecified: Secondary | ICD-10-CM | POA: Diagnosis not present

## 2018-08-13 DIAGNOSIS — R079 Chest pain, unspecified: Secondary | ICD-10-CM | POA: Diagnosis not present

## 2018-08-13 DIAGNOSIS — I700 Atherosclerosis of aorta: Secondary | ICD-10-CM | POA: Diagnosis not present

## 2018-08-13 DIAGNOSIS — Z79899 Other long term (current) drug therapy: Secondary | ICD-10-CM | POA: Diagnosis not present

## 2018-08-13 DIAGNOSIS — M1990 Unspecified osteoarthritis, unspecified site: Secondary | ICD-10-CM | POA: Diagnosis not present

## 2018-08-13 DIAGNOSIS — K219 Gastro-esophageal reflux disease without esophagitis: Secondary | ICD-10-CM | POA: Diagnosis not present

## 2018-08-13 DIAGNOSIS — H9193 Unspecified hearing loss, bilateral: Secondary | ICD-10-CM | POA: Diagnosis not present

## 2018-08-14 DIAGNOSIS — I358 Other nonrheumatic aortic valve disorders: Secondary | ICD-10-CM | POA: Diagnosis not present

## 2018-08-14 DIAGNOSIS — R079 Chest pain, unspecified: Secondary | ICD-10-CM | POA: Diagnosis not present

## 2018-08-14 DIAGNOSIS — R7989 Other specified abnormal findings of blood chemistry: Secondary | ICD-10-CM | POA: Diagnosis not present

## 2018-08-14 DIAGNOSIS — I517 Cardiomegaly: Secondary | ICD-10-CM | POA: Diagnosis not present

## 2018-08-24 DIAGNOSIS — R0789 Other chest pain: Secondary | ICD-10-CM | POA: Diagnosis not present

## 2018-08-24 DIAGNOSIS — E876 Hypokalemia: Secondary | ICD-10-CM | POA: Diagnosis not present

## 2018-08-24 DIAGNOSIS — Z6823 Body mass index (BMI) 23.0-23.9, adult: Secondary | ICD-10-CM | POA: Diagnosis not present

## 2018-09-05 ENCOUNTER — Other Ambulatory Visit: Payer: Self-pay | Admitting: Urology

## 2018-09-05 ENCOUNTER — Other Ambulatory Visit (HOSPITAL_COMMUNITY): Payer: Self-pay | Admitting: Urology

## 2018-09-05 DIAGNOSIS — C61 Malignant neoplasm of prostate: Secondary | ICD-10-CM

## 2018-09-07 ENCOUNTER — Encounter (HOSPITAL_COMMUNITY): Payer: Self-pay

## 2018-09-07 ENCOUNTER — Ambulatory Visit (HOSPITAL_COMMUNITY)
Admission: RE | Admit: 2018-09-07 | Discharge: 2018-09-07 | Disposition: A | Discharge status: Home / Self Care | Payer: Medicare HMO | Source: Ambulatory Visit | Attending: Urology | Admitting: Urology

## 2018-09-07 ENCOUNTER — Other Ambulatory Visit: Payer: Self-pay

## 2018-09-07 DIAGNOSIS — M25561 Pain in right knee: Secondary | ICD-10-CM | POA: Diagnosis not present

## 2018-09-07 DIAGNOSIS — M25562 Pain in left knee: Secondary | ICD-10-CM | POA: Diagnosis not present

## 2018-09-07 DIAGNOSIS — C61 Malignant neoplasm of prostate: Secondary | ICD-10-CM

## 2018-09-07 DIAGNOSIS — M25551 Pain in right hip: Secondary | ICD-10-CM | POA: Diagnosis not present

## 2018-09-07 DIAGNOSIS — M25552 Pain in left hip: Secondary | ICD-10-CM | POA: Diagnosis not present

## 2018-09-07 HISTORY — DX: Malignant (primary) neoplasm, unspecified: C80.1

## 2018-09-07 MED ORDER — TECHNETIUM TC 99M MEDRONATE IV KIT
20.0000 | PACK | Freq: Once | INTRAVENOUS | Status: AC | PRN
  Administered 2018-09-07: 20.5 via INTRAVENOUS

## 2018-09-11 ENCOUNTER — Ambulatory Visit (INDEPENDENT_AMBULATORY_CARE_PROVIDER_SITE_OTHER): Payer: Medicare HMO | Admitting: Urology

## 2018-09-11 ENCOUNTER — Other Ambulatory Visit: Payer: Self-pay

## 2018-09-11 DIAGNOSIS — C61 Malignant neoplasm of prostate: Secondary | ICD-10-CM

## 2018-09-18 ENCOUNTER — Ambulatory Visit (INDEPENDENT_AMBULATORY_CARE_PROVIDER_SITE_OTHER): Payer: Medicare HMO | Admitting: Urology

## 2018-09-18 DIAGNOSIS — C61 Malignant neoplasm of prostate: Secondary | ICD-10-CM | POA: Diagnosis not present

## 2018-10-16 ENCOUNTER — Ambulatory Visit (INDEPENDENT_AMBULATORY_CARE_PROVIDER_SITE_OTHER): Payer: Medicare HMO | Admitting: Urology

## 2018-10-16 DIAGNOSIS — Z79899 Other long term (current) drug therapy: Secondary | ICD-10-CM | POA: Diagnosis not present

## 2018-10-16 DIAGNOSIS — C61 Malignant neoplasm of prostate: Secondary | ICD-10-CM

## 2018-10-16 DIAGNOSIS — C7951 Secondary malignant neoplasm of bone: Secondary | ICD-10-CM

## 2018-10-23 ENCOUNTER — Ambulatory Visit: Payer: Medicare HMO | Admitting: Urology

## 2018-12-05 DIAGNOSIS — R0602 Shortness of breath: Secondary | ICD-10-CM | POA: Diagnosis not present

## 2018-12-05 DIAGNOSIS — Z23 Encounter for immunization: Secondary | ICD-10-CM | POA: Diagnosis not present

## 2018-12-05 DIAGNOSIS — I959 Hypotension, unspecified: Secondary | ICD-10-CM | POA: Diagnosis not present

## 2018-12-05 DIAGNOSIS — R4182 Altered mental status, unspecified: Secondary | ICD-10-CM | POA: Diagnosis not present

## 2018-12-05 DIAGNOSIS — I2699 Other pulmonary embolism without acute cor pulmonale: Secondary | ICD-10-CM | POA: Diagnosis not present

## 2018-12-05 DIAGNOSIS — R1110 Vomiting, unspecified: Secondary | ICD-10-CM | POA: Diagnosis not present

## 2018-12-05 DIAGNOSIS — I10 Essential (primary) hypertension: Secondary | ICD-10-CM | POA: Diagnosis not present

## 2018-12-05 DIAGNOSIS — R0902 Hypoxemia: Secondary | ICD-10-CM | POA: Diagnosis not present

## 2018-12-05 DIAGNOSIS — I2510 Atherosclerotic heart disease of native coronary artery without angina pectoris: Secondary | ICD-10-CM | POA: Diagnosis not present

## 2018-12-05 DIAGNOSIS — R7989 Other specified abnormal findings of blood chemistry: Secondary | ICD-10-CM | POA: Diagnosis not present

## 2018-12-05 DIAGNOSIS — R262 Difficulty in walking, not elsewhere classified: Secondary | ICD-10-CM | POA: Diagnosis not present

## 2018-12-05 DIAGNOSIS — I4510 Unspecified right bundle-branch block: Secondary | ICD-10-CM | POA: Diagnosis not present

## 2018-12-05 DIAGNOSIS — R410 Disorientation, unspecified: Secondary | ICD-10-CM | POA: Diagnosis not present

## 2018-12-05 DIAGNOSIS — R404 Transient alteration of awareness: Secondary | ICD-10-CM | POA: Diagnosis not present

## 2018-12-05 DIAGNOSIS — Z1159 Encounter for screening for other viral diseases: Secondary | ICD-10-CM | POA: Diagnosis not present

## 2018-12-05 DIAGNOSIS — Z20828 Contact with and (suspected) exposure to other viral communicable diseases: Secondary | ICD-10-CM | POA: Diagnosis not present

## 2018-12-05 DIAGNOSIS — R55 Syncope and collapse: Secondary | ICD-10-CM | POA: Diagnosis not present

## 2018-12-06 DIAGNOSIS — I959 Hypotension, unspecified: Secondary | ICD-10-CM | POA: Diagnosis not present

## 2018-12-06 DIAGNOSIS — R55 Syncope and collapse: Secondary | ICD-10-CM | POA: Diagnosis not present

## 2018-12-06 DIAGNOSIS — R7989 Other specified abnormal findings of blood chemistry: Secondary | ICD-10-CM | POA: Diagnosis not present

## 2018-12-06 DIAGNOSIS — R0602 Shortness of breath: Secondary | ICD-10-CM | POA: Diagnosis not present

## 2018-12-06 DIAGNOSIS — F1010 Alcohol abuse, uncomplicated: Secondary | ICD-10-CM | POA: Diagnosis not present

## 2018-12-06 DIAGNOSIS — I2699 Other pulmonary embolism without acute cor pulmonale: Secondary | ICD-10-CM | POA: Diagnosis not present

## 2018-12-06 DIAGNOSIS — I10 Essential (primary) hypertension: Secondary | ICD-10-CM | POA: Diagnosis not present

## 2018-12-06 DIAGNOSIS — R000 Tachycardia, unspecified: Secondary | ICD-10-CM | POA: Diagnosis not present

## 2018-12-07 DIAGNOSIS — R55 Syncope and collapse: Secondary | ICD-10-CM | POA: Diagnosis not present

## 2018-12-07 DIAGNOSIS — D696 Thrombocytopenia, unspecified: Secondary | ICD-10-CM | POA: Diagnosis not present

## 2018-12-07 DIAGNOSIS — I2699 Other pulmonary embolism without acute cor pulmonale: Secondary | ICD-10-CM | POA: Diagnosis not present

## 2018-12-08 DIAGNOSIS — R55 Syncope and collapse: Secondary | ICD-10-CM | POA: Diagnosis not present

## 2018-12-08 DIAGNOSIS — I10 Essential (primary) hypertension: Secondary | ICD-10-CM | POA: Diagnosis not present

## 2018-12-08 DIAGNOSIS — Z20828 Contact with and (suspected) exposure to other viral communicable diseases: Secondary | ICD-10-CM | POA: Diagnosis not present

## 2018-12-08 DIAGNOSIS — I2699 Other pulmonary embolism without acute cor pulmonale: Secondary | ICD-10-CM | POA: Diagnosis not present

## 2018-12-28 DIAGNOSIS — M7021 Olecranon bursitis, right elbow: Secondary | ICD-10-CM | POA: Diagnosis not present

## 2018-12-28 DIAGNOSIS — Z6827 Body mass index (BMI) 27.0-27.9, adult: Secondary | ICD-10-CM | POA: Diagnosis not present

## 2019-01-07 DIAGNOSIS — Z6827 Body mass index (BMI) 27.0-27.9, adult: Secondary | ICD-10-CM | POA: Diagnosis not present

## 2019-01-07 DIAGNOSIS — I2699 Other pulmonary embolism without acute cor pulmonale: Secondary | ICD-10-CM | POA: Diagnosis not present

## 2019-02-19 ENCOUNTER — Ambulatory Visit: Payer: Medicare HMO | Admitting: Urology

## 2019-03-29 DIAGNOSIS — E7849 Other hyperlipidemia: Secondary | ICD-10-CM | POA: Diagnosis not present

## 2019-03-29 DIAGNOSIS — I10 Essential (primary) hypertension: Secondary | ICD-10-CM | POA: Diagnosis not present

## 2019-04-02 ENCOUNTER — Other Ambulatory Visit: Payer: Self-pay

## 2019-04-02 ENCOUNTER — Ambulatory Visit (INDEPENDENT_AMBULATORY_CARE_PROVIDER_SITE_OTHER): Payer: Medicare HMO | Admitting: Urology

## 2019-04-02 ENCOUNTER — Encounter: Payer: Self-pay | Admitting: Urology

## 2019-04-02 VITALS — BP 167/73 | HR 82 | Temp 97.5°F | Ht 67.0 in | Wt 176.0 lb

## 2019-04-02 DIAGNOSIS — C61 Malignant neoplasm of prostate: Secondary | ICD-10-CM | POA: Diagnosis not present

## 2019-04-02 DIAGNOSIS — N401 Enlarged prostate with lower urinary tract symptoms: Secondary | ICD-10-CM

## 2019-04-02 DIAGNOSIS — Z79899 Other long term (current) drug therapy: Secondary | ICD-10-CM

## 2019-04-02 LAB — POCT URINALYSIS DIPSTICK
Bilirubin, UA: NEGATIVE
Glucose, UA: NEGATIVE
Ketones, UA: NEGATIVE
Leukocytes, UA: NEGATIVE
Nitrite, UA: NEGATIVE
Protein, UA: POSITIVE — AB
Spec Grav, UA: >=1.03 — AB (ref 1.010–1.025)
Urobilinogen, UA: 0.2 E.U./dL
pH, UA: 5 (ref 5.0–8.0)

## 2019-04-02 MED ORDER — LEUPROLIDE ACETATE (4 MONTH) 30 MG IM KIT
30.0000 mg | PACK | Freq: Once | INTRAMUSCULAR | Status: AC
  Administered 2019-04-02: 15:00:00 30 mg via INTRAMUSCULAR

## 2019-04-02 NOTE — Progress Notes (Signed)

## 2019-04-02 NOTE — Progress Notes (Signed)
H&P  Chief Complaint: Prostate Cancer  History of Present Illness:   3.2.2021: Most recent PSA was on 9.15.2020 -- 4.8 ng/dl. Here today for follow-up and 4 mo lupron. He reports that after his last injection he was unable to walk up until the last few weeks. He also reports having had a PE 2 months ago. He denies any blood per urine or stool or any urinary issues. His only bone pain has been on his left side (most often left knee but occassionally the posterior left hip). Stable voiding sx's.  IPSS Questionnaire (AUA-7): Over the past month.   1)  How often have you had a sensation of not emptying your bladder completely after you finish urinating?  0 - Not at all  2)  How often have you had to urinate again less than two hours after you finished urinating? 3 - About half the time  3)  How often have you found you stopped and started again several times when you urinated?  0 - Not at all  4) How difficult have you found it to postpone urination?  3 - About half the time  5) How often have you had a weak urinary stream?  3 - About half the time  6) How often have you had to push or strain to begin urination?  0 - Not at all  7) How many times did you most typically get up to urinate from the time you went to bed until the time you got up in the morning?  3 - 3 times  Total score:  0-7 mildly symptomatic   8-19 moderately symptomatic   20-35 severely symptomatic   Total: 11 QoL: (not reported)  (below copied from [facility] records):  Prostate Cancer:  Robert Richards is a 79 year-old male established patient who is here evaluation for treatment of prostate cancer.  His prostate cancer was diagnosed 07/03/2018. His PSA at his time of diagnosis was 10.7.   6.2.2020: TRUS/Bx for PSA of 10.7. Volume 68 ml, PSAD 0.16.  6/12 cores positive--  4 revealed GS 3+3 pattern  1 revealed GS 3+4 pattern  1 revealed GS 4+4 pattern   6.29.2020: CT A/P  1. Ill-defined area of asymmetric sclerosis in the  right iliac bone  is noted. Cannot rule out osseous metastatic disease.  2. No findings to suggest solid organ metastasis or nodal metastasis  within the abdomen or pelvis.  3. There is a hyperdense lesion within the upper pole of right  kidney measuring 9 mm and 93 HU. Primary differential considerations  include small renal cell carcinoma versus hemorrhagic/hyperdense  cyst. Suggest further evaluation with contrast enhanced MRI of the  kidneys.  4. Aortic Atherosclerosis   8.7.2020: Bone scan  Sites of abnormal tracer localization at the posterior RIGHT  eleventh rib and at the RIGHT iliac bone, cannot exclude osseous  metastatic disease.   8.11.2020: At today's follow-up he reports having had boney pain in his right pelvis for 3-4 mo's.   8.18.2020: Here for Firmagon injection to commence with LTADT as primary mgmt of probable metastatic PCa.   9.15.2020: He has not had a repeat PSA this month. He tolerated firmagon injections well but he thinks this made him urinate a lot more frequently (7-9x nocturia one night). He has also been having hot flashes since his first injection.    10/16/18 04/10/18 06/05/17 03/10/17  Total PSA 4.8 ng/dl 10.7 ng/dl 10.1 ng/dl 10.3 ng/dl    BPH:  Patient is  currently treated with Tamsulosin for his symptoms.   11.26.2019: Presented with BPH w/ LUTS. IPSS 26 QOL score 3. He was started on tamsulosin.   3.10.2020: IPSS score now is 21, but he is quite happy with the improvement in urination using tamsulosin. No significant SE's.   9.15.2020: With starting ADT he states that his urination has become much more frequent but it is apparently beginning to return to a more baseline pattern. He also continues on tamsulosin.   Past Medical History:  Diagnosis Date  . Aneurysmal dilatation ([place])    OF RAMUS INTERRMEDIUS  . Cancer [facility])    Prostate  . Chronic low back pain 03/18/2014  . Coronary artery disease    NONOBSTRUCTIVE  . DVT (deep venous  thrombosis) ([place])   . Hyperlipidemia   . Hypertension     Past Surgical History:  Procedure Laterality Date  . CARDIAC CATHETERIZATION  10/2007  . HERNIA REPAIR Bilateral   . SHOULDER SURGERY Left     Home Medications:  Allergies as of 04/02/2019   No Known Allergies     Medication List       Accurate as of April 02, 2019  2:58 PM. If you have any questions, ask your nurse or doctor.        STOP taking these medications   diazepam 5 MG tablet Commonly known as: VALIUM Stopped by: Jorja Loa, MD   gabapentin 300 MG capsule Commonly known as: NEURONTIN Stopped by: Jorja Loa, MD   HYDROcodone-acetaminophen 5-325 MG tablet Commonly known as: NORCO/VICODIN Stopped by: Jorja Loa, MD   warfarin 2.5 MG tablet Commonly known as: COUMADIN Stopped by: Jorja Loa, MD     TAKE these medications   amLODipine 10 MG tablet Commonly known as: NORVASC Take 10 mg by mouth at bedtime.   Eliquis 5 MG Tabs tablet Generic drug: apixaban   metoprolol tartrate 50 MG tablet Commonly known as: LOPRESSOR   tamsulosin 0.4 MG Caps capsule Commonly known as: FLOMAX       Allergies: No Known Allergies  Family History  Problem Relation Age of Onset  . Heart attack Father   . Diabetes Mother   . Heart Problems Mother   . Gout Mother     Social History:  reports that he has quit smoking. His smoking use included cigarettes. He has a 5.00 pack-year smoking history. He has never used smokeless tobacco. He reports current alcohol use of about 1.0 standard drinks of alcohol per week. He reports that he does not use drugs.  ROS: A complete review of systems was performed.  All systems are negative except for pertinent findings as noted.  Physical Exam:  Vital signs in last 24 hours: BP (!) 167/73   Pulse 82   Temp (!) 97.5 F (36.4 C)   Ht 5\' 7"  (1.702 m)   Wt 176 lb (79.8 kg)   BMI 27.57 kg/m  Constitutional:  Alert and oriented, No acute  distress Cardiovascular: Regular rate  Respiratory: Normal respiratory effort GI: Abdomen is soft, nontender, nondistended, no abdominal masses. No CVAT. No hernias. Genitourinary: Normal male phallus (uncircumcised), testes are descended bilaterally and non-tender and without masses and atrophic, scrotum is normal in appearance without lesions or masses, perineum is normal on inspection. Prostate feels around 20 grams in size. Lymphatic: No lymphadenopathy Neurologic: Grossly intact, no focal deficits Psychiatric: Normal mood and affect  Laboratory Data:  No results for input(s): WBC, HGB, HCT, PLT in the last  72 hours.  No results for input(s): NA, K, CL, GLUCOSE, BUN, CALCIUM, CREATININE in the last 72 hours.  Invalid input(s): CO3   Results for orders placed or performed in visit on 04/02/19 (from the past 24 hour(s))  POCT urinalysis dipstick     Status: Abnormal   Collection Time: 04/02/19  2:26 PM  Result Value Ref Range   Color, UA yellow    Clarity, UA     Glucose, UA Negative Negative   Bilirubin, UA neg    Ketones, UA neg    Spec Grav, UA >=1.030 (A) 1.010 - 1.025   Blood, UA +++    pH, UA 5.0 5.0 - 8.0   Protein, UA Positive (A) Negative   Urobilinogen, UA 0.2 0.2 or 1.0 E.U./dL   Nitrite, UA neg    Leukocytes, UA Negative Negative   Appearance clear    Odor      I have reviewed prior pt notes  I have reviewed notes from referring/previous physicians  I have reviewed urinalysis results  I have reviewed prior PSA results   Impression/Assessment:  Stable urinary sx's with no new bony pain. Overall he is doing quite well. His most recent lupron apparently left him unable to walk without pain for a few months -- it is unclear if this was related, though its possible this could have incidentally been near his sciatic nerve.   Plan:  1. 4 mo eligard today  2. Return in 5 mo for OV w/ 4 mo lupron  3. PSA today -- will forward results.

## 2019-04-10 ENCOUNTER — Other Ambulatory Visit: Payer: Self-pay

## 2019-04-10 DIAGNOSIS — N401 Enlarged prostate with lower urinary tract symptoms: Secondary | ICD-10-CM

## 2019-04-10 MED ORDER — TAMSULOSIN HCL 0.4 MG PO CAPS: 0.4000 mg | ORAL_CAPSULE | Freq: Every day | ORAL | 11 refills | Status: DC

## 2019-05-01 DIAGNOSIS — Z20828 Contact with and (suspected) exposure to other viral communicable diseases: Secondary | ICD-10-CM | POA: Diagnosis not present

## 2019-05-01 DIAGNOSIS — Z6827 Body mass index (BMI) 27.0-27.9, adult: Secondary | ICD-10-CM | POA: Diagnosis not present

## 2019-05-01 DIAGNOSIS — R062 Wheezing: Secondary | ICD-10-CM | POA: Diagnosis not present

## 2019-05-01 DIAGNOSIS — R252 Cramp and spasm: Secondary | ICD-10-CM | POA: Diagnosis not present

## 2019-05-01 DIAGNOSIS — Z20822 Contact with and (suspected) exposure to covid-19: Secondary | ICD-10-CM | POA: Diagnosis not present

## 2019-05-30 DIAGNOSIS — D692 Other nonthrombocytopenic purpura: Secondary | ICD-10-CM | POA: Diagnosis not present

## 2019-05-30 DIAGNOSIS — Z6828 Body mass index (BMI) 28.0-28.9, adult: Secondary | ICD-10-CM | POA: Diagnosis not present

## 2019-05-30 DIAGNOSIS — I10 Essential (primary) hypertension: Secondary | ICD-10-CM | POA: Diagnosis not present

## 2019-05-30 DIAGNOSIS — D6859 Other primary thrombophilia: Secondary | ICD-10-CM | POA: Diagnosis not present

## 2019-05-30 DIAGNOSIS — N400 Enlarged prostate without lower urinary tract symptoms: Secondary | ICD-10-CM | POA: Diagnosis not present

## 2019-05-30 DIAGNOSIS — Z7901 Long term (current) use of anticoagulants: Secondary | ICD-10-CM | POA: Diagnosis not present

## 2019-05-31 DIAGNOSIS — I10 Essential (primary) hypertension: Secondary | ICD-10-CM | POA: Diagnosis not present

## 2019-05-31 DIAGNOSIS — E7849 Other hyperlipidemia: Secondary | ICD-10-CM | POA: Diagnosis not present

## 2019-06-10 ENCOUNTER — Telehealth: Payer: Self-pay | Admitting: Urology

## 2019-06-10 NOTE — Telephone Encounter (Signed)
Pt called and requests a nurse return his call regarding a medication issue.

## 2019-06-10 NOTE — Telephone Encounter (Signed)
Called pt.  No answer.  Left message

## 2019-06-12 NOTE — Telephone Encounter (Signed)
Called pt. Left message to return call.

## 2019-06-28 ENCOUNTER — Telehealth: Payer: Self-pay

## 2019-06-28 NOTE — Telephone Encounter (Signed)
Called pt and left message

## 2019-06-28 NOTE — Telephone Encounter (Signed)
-----   Message from Franchot Gallo, MD sent at 06/25/2019 11:03 AM EDT ----- Regarding: RE: frequecy Pt seen in March. Did not get PSA drawn as I wanted. Nocturia may be d/t peripheral edema--does he have swollen legs? If so, might be best for him to see PCP or cardiologist if he has one. If no edema, we need to see him to check. ----- Message ----- From: Dorisann Frames, RN Sent: 06/25/2019  10:52 AM EDT To: Franchot Gallo, MD Subject: frequecy                                       Pt called this am and still reports of frequency of 8-10 times nightly. Pt is on flomax. Also c/o left sided hip pain ( he said his PCP treated him last time for this pain with mobility so he will call her back) pt asking what can he do for his frequency

## 2019-07-09 DIAGNOSIS — Z6826 Body mass index (BMI) 26.0-26.9, adult: Secondary | ICD-10-CM | POA: Diagnosis not present

## 2019-07-09 DIAGNOSIS — M1612 Unilateral primary osteoarthritis, left hip: Secondary | ICD-10-CM | POA: Diagnosis not present

## 2019-07-10 DIAGNOSIS — C61 Malignant neoplasm of prostate: Secondary | ICD-10-CM | POA: Diagnosis not present

## 2019-07-10 DIAGNOSIS — D692 Other nonthrombocytopenic purpura: Secondary | ICD-10-CM | POA: Diagnosis not present

## 2019-07-10 DIAGNOSIS — D6859 Other primary thrombophilia: Secondary | ICD-10-CM | POA: Diagnosis not present

## 2019-07-10 DIAGNOSIS — N400 Enlarged prostate without lower urinary tract symptoms: Secondary | ICD-10-CM | POA: Diagnosis not present

## 2019-07-10 DIAGNOSIS — Z6827 Body mass index (BMI) 27.0-27.9, adult: Secondary | ICD-10-CM | POA: Diagnosis not present

## 2019-07-10 DIAGNOSIS — I10 Essential (primary) hypertension: Secondary | ICD-10-CM | POA: Diagnosis not present

## 2019-07-10 DIAGNOSIS — M25551 Pain in right hip: Secondary | ICD-10-CM | POA: Diagnosis not present

## 2019-07-10 DIAGNOSIS — C7951 Secondary malignant neoplasm of bone: Secondary | ICD-10-CM | POA: Diagnosis not present

## 2019-07-10 DIAGNOSIS — Z7901 Long term (current) use of anticoagulants: Secondary | ICD-10-CM | POA: Diagnosis not present

## 2019-07-18 DIAGNOSIS — Z20822 Contact with and (suspected) exposure to covid-19: Secondary | ICD-10-CM | POA: Diagnosis not present

## 2019-07-18 DIAGNOSIS — R05 Cough: Secondary | ICD-10-CM | POA: Diagnosis not present

## 2019-07-18 DIAGNOSIS — N400 Enlarged prostate without lower urinary tract symptoms: Secondary | ICD-10-CM | POA: Diagnosis not present

## 2019-07-18 DIAGNOSIS — I10 Essential (primary) hypertension: Secondary | ICD-10-CM | POA: Diagnosis not present

## 2019-07-18 DIAGNOSIS — F17210 Nicotine dependence, cigarettes, uncomplicated: Secondary | ICD-10-CM | POA: Diagnosis not present

## 2019-07-18 DIAGNOSIS — I959 Hypotension, unspecified: Secondary | ICD-10-CM | POA: Diagnosis not present

## 2019-07-18 DIAGNOSIS — I499 Cardiac arrhythmia, unspecified: Secondary | ICD-10-CM | POA: Diagnosis not present

## 2019-07-18 DIAGNOSIS — Z7901 Long term (current) use of anticoagulants: Secondary | ICD-10-CM | POA: Diagnosis not present

## 2019-07-18 DIAGNOSIS — R55 Syncope and collapse: Secondary | ICD-10-CM | POA: Diagnosis not present

## 2019-07-19 DIAGNOSIS — R55 Syncope and collapse: Secondary | ICD-10-CM | POA: Diagnosis not present

## 2019-07-19 DIAGNOSIS — R079 Chest pain, unspecified: Secondary | ICD-10-CM | POA: Diagnosis not present

## 2019-07-19 DIAGNOSIS — Z720 Tobacco use: Secondary | ICD-10-CM | POA: Diagnosis not present

## 2019-07-19 DIAGNOSIS — I10 Essential (primary) hypertension: Secondary | ICD-10-CM | POA: Diagnosis not present

## 2019-07-19 DIAGNOSIS — Z86718 Personal history of other venous thrombosis and embolism: Secondary | ICD-10-CM | POA: Diagnosis not present

## 2019-07-19 DIAGNOSIS — Z8679 Personal history of other diseases of the circulatory system: Secondary | ICD-10-CM | POA: Diagnosis not present

## 2019-07-20 DIAGNOSIS — R55 Syncope and collapse: Secondary | ICD-10-CM | POA: Diagnosis not present

## 2019-07-20 DIAGNOSIS — F17200 Nicotine dependence, unspecified, uncomplicated: Secondary | ICD-10-CM | POA: Diagnosis not present

## 2019-07-20 DIAGNOSIS — I10 Essential (primary) hypertension: Secondary | ICD-10-CM | POA: Diagnosis not present

## 2019-07-31 DIAGNOSIS — R972 Elevated prostate specific antigen [PSA]: Secondary | ICD-10-CM | POA: Diagnosis not present

## 2019-07-31 DIAGNOSIS — I10 Essential (primary) hypertension: Secondary | ICD-10-CM | POA: Diagnosis not present

## 2019-07-31 DIAGNOSIS — E7849 Other hyperlipidemia: Secondary | ICD-10-CM | POA: Diagnosis not present

## 2019-07-31 DIAGNOSIS — Z86711 Personal history of pulmonary embolism: Secondary | ICD-10-CM | POA: Diagnosis not present

## 2019-07-31 DIAGNOSIS — E876 Hypokalemia: Secondary | ICD-10-CM | POA: Diagnosis not present

## 2019-07-31 DIAGNOSIS — R55 Syncope and collapse: Secondary | ICD-10-CM | POA: Diagnosis not present

## 2019-09-03 ENCOUNTER — Ambulatory Visit: Payer: Medicare HMO | Admitting: Urology

## 2019-10-22 ENCOUNTER — Ambulatory Visit: Payer: Medicare HMO | Admitting: Urology

## 2019-10-22 NOTE — Progress Notes (Incomplete)
H&P  Chief Complaint: Prostate Cancer  History of Present Illness:  9.21.2021:   (below copied from [facility] records):  Prostate Cancer:  Robert Richards is a 79 year-old male established patient who is here evaluation for treatment of prostate cancer.  His prostate cancer was diagnosed 07/03/2018. His PSA at his time of diagnosis was 10.7.   6.2.2020: TRUS/Bx for PSA of 10.7. Volume 68 ml, PSAD 0.16.  6/12 cores positive--  4 revealed GS 3+3 pattern  1 revealed GS 3+4 pattern  1 revealed GS 4+4 pattern   6.29.2020: CT A/P  1. Ill-defined area of asymmetric sclerosis in the right iliac bone  is noted. Cannot rule out osseous metastatic disease.  2. No findings to suggest solid organ metastasis or nodal metastasis  within the abdomen or pelvis.  3. There is a hyperdense lesion within the upper pole of right  kidney measuring 9 mm and 93 HU. Primary differential considerations  include small renal cell carcinoma versus hemorrhagic/hyperdense  cyst. Suggest further evaluation with contrast enhanced MRI of the  kidneys.  4. Aortic Atherosclerosis   8.7.2020: Bone scan  Sites of abnormal tracer localization at the posterior RIGHT  eleventh rib and at the RIGHT iliac bone, cannot exclude osseous  metastatic disease.   8.11.2020: At today's follow-up he reports having had boney pain in his right pelvis for 3-4 mo's.   8.18.2020: Here for Firmagon injection to commence with LTADT as primary mgmt of probable metastatic PCa.   9.15.2020: He has not had a repeat PSA this month. He tolerated firmagon injections well but he thinks this made him urinate a lot more frequently (7-9x nocturia one night). He has also been having hot flashes since his first injection.    10/16/18 04/10/18 06/05/17 03/10/17  Total PSA 4.8 ng/dl 10.7 ng/dl 10.1 ng/dl 10.3 ng/dl   3.2.2021: Most recent PSA was on 9.15.2020 -- 4.8 ng/dl. Here today for follow-up and 4 mo lupron. He reports that after his  last injection he was unable to walk up until the last few weeks. He also reports having had a PE 2 months ago. He denies any blood per urine or stool or any urinary issues. His only bone pain has been on his left side (most often left knee but occassionally the posterior left hip). Stable voiding sx's.  BPH:  Patient is currently treated with Tamsulosin for his symptoms.   11.26.2019: Presented with BPH w/ LUTS. IPSS 26 QOL score 3. He was started on tamsulosin.   3.10.2020: IPSS score now is 21, but he is quite happy with the improvement in urination using tamsulosin. No significant SE's.   9.15.2020: With starting ADT he states that his urination has become much more frequent but it is apparently beginning to return to a more baseline pattern. He also continues on tamsulosin.    Past Medical History:  Diagnosis Date  . 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 Cancer [facility])    Prostate  . Chronic low back pain 03/18/2014  . Coronary artery disease    NONOBSTRUCTIVE  . DVT (deep venous thrombosis) ([place])   . Hyperlipidemia   . Hypertension     Past Surgical History:  Procedure Laterality Date  . CARDIAC CATHETERIZATION  10/2007  . HERNIA REPAIR Bilateral   . SHOULDER SURGERY Left     Home Medications:  Allergies as of 10/22/2019   No Known Allergies     Medication List       Accurate  as of October 22, 2019  1:06 PM. If you have any questions, ask your nurse or doctor.        amLODipine 10 MG tablet Commonly known as: NORVASC Take 10 mg by mouth at bedtime.   Eliquis 5 MG Tabs tablet Generic drug: apixaban   metoprolol tartrate 50 MG tablet Commonly known as: LOPRESSOR   tamsulosin 0.4 MG Caps capsule Commonly known as: FLOMAX Take 1 capsule (0.4 mg total) by mouth daily.       Allergies: No Known Allergies  Family History  Problem Relation Age of Onset  . Heart attack Father   . Diabetes Mother   . Heart Problems Mother   . Gout  Mother     Social History:  reports that he has quit smoking. His smoking use included cigarettes. He has a 5.00 pack-year smoking history. He has never used smokeless tobacco. He reports current alcohol use of about 1.0 standard drink of alcohol per week. He reports that he does not use drugs.  ROS: A complete review of systems was performed.  All systems are negative except for pertinent findings as noted.  Physical Exam:  Vital signs in last 24 hours: There were no vitals taken for this visit. Constitutional:  Alert and oriented, No acute distress Cardiovascular: Regular rate  Respiratory: Normal respiratory effort GI: Abdomen is soft, nontender, nondistended, no abdominal masses. No CVAT.  Genitourinary: Normal male phallus, testes are descended bilaterally and non-tender and without masses, scrotum is normal in appearance without lesions or masses, perineum is normal on inspection. Lymphatic: No lymphadenopathy Neurologic: Grossly intact, no focal deficits Psychiatric: Normal mood and affect  Laboratory Data:  No results for input(s): WBC, HGB, HCT, PLT in the last 72 hours.  No results for input(s): NA, K, CL, GLUCOSE, BUN, CALCIUM, CREATININE in the last 72 hours.  Invalid input(s): CO3   No results found for this or any previous visit (from the past 24 hour(s)). No results found for this or any previous visit (from the past 240 hour(s)).  Renal Function: No results for input(s): CREATININE in the last 168 hours. CrCl cannot be calculated (Patient's most recent lab result is older than the maximum 21 days allowed.).  Radiologic Imaging: No results found.  Impression/Assessment:  ***  Plan:  ***

## 2019-11-08 DIAGNOSIS — Z8679 Personal history of other diseases of the circulatory system: Secondary | ICD-10-CM | POA: Diagnosis not present

## 2019-11-08 DIAGNOSIS — Z09 Encounter for follow-up examination after completed treatment for conditions other than malignant neoplasm: Secondary | ICD-10-CM | POA: Diagnosis not present

## 2019-11-19 ENCOUNTER — Other Ambulatory Visit: Payer: Self-pay

## 2019-11-19 ENCOUNTER — Ambulatory Visit (INDEPENDENT_AMBULATORY_CARE_PROVIDER_SITE_OTHER): Payer: Medicare HMO | Admitting: Urology

## 2019-11-19 ENCOUNTER — Encounter: Payer: Self-pay | Admitting: Urology

## 2019-11-19 VITALS — BP 94/72 | HR 87 | Temp 98.8°F | Ht 67.0 in | Wt 176.0 lb

## 2019-11-19 DIAGNOSIS — Z79899 Other long term (current) drug therapy: Secondary | ICD-10-CM | POA: Diagnosis not present

## 2019-11-19 DIAGNOSIS — C7951 Secondary malignant neoplasm of bone: Secondary | ICD-10-CM | POA: Diagnosis not present

## 2019-11-19 DIAGNOSIS — R9721 Rising PSA following treatment for malignant neoplasm of prostate: Secondary | ICD-10-CM

## 2019-11-19 DIAGNOSIS — C61 Malignant neoplasm of prostate: Secondary | ICD-10-CM | POA: Diagnosis not present

## 2019-11-19 LAB — URINALYSIS, ROUTINE W REFLEX MICROSCOPIC
Bilirubin, UA: NEGATIVE
Glucose, UA: NEGATIVE
Ketones, UA: NEGATIVE
Leukocytes,UA: NEGATIVE
Nitrite, UA: NEGATIVE
Protein,UA: NEGATIVE
Specific Gravity, UA: 1.025 (ref 1.005–1.030)
Urobilinogen, Ur: 0.2 mg/dL (ref 0.2–1.0)
pH, UA: 5 (ref 5.0–7.5)

## 2019-11-19 LAB — MICROSCOPIC EXAMINATION
Bacteria, UA: NONE SEEN
Epithelial Cells (non renal): NONE SEEN /hpf (ref 0–10)
Renal Epithel, UA: NONE SEEN /hpf
WBC, UA: NONE SEEN /hpf (ref 0–5)

## 2019-11-19 MED ORDER — LEUPROLIDE ACETATE (4 MONTH) 30 MG ~~LOC~~ KIT
30.0000 mg | PACK | Freq: Once | SUBCUTANEOUS | Status: AC
  Administered 2019-11-19: 30 mg via SUBCUTANEOUS

## 2019-11-19 NOTE — Progress Notes (Signed)

## 2019-11-19 NOTE — Progress Notes (Signed)
H&P  Chief Complaint: Prostate Cancer  History of Present Illness:   10.19.2021: Pt here for F/U of PCa and leuprolide. Pt reports pain in his pelvic  bones and is experiencing moderate LUTS including urgency incontinence.  IPSS Questionnaire (AUA-7): Over the past month   1)  How often have you had a sensation of not emptying your bladder completely after you finish urinating?  3 - About half the time  2)  How often have you had to urinate again less than two hours after you finished urinating? 3 - About half the time  3)  How often have you found you stopped and started again several times when you urinated?  1 - Less than 1 time in 5  4) How difficult have you found it to postpone urination?  5 - Almost always  5) How often have you had a weak urinary stream?  0 - Not at all  6) How often have you had to push or strain to begin urination?  1 - Less than 1 time in 5  7) How many times did you most typically get up to urinate from the time you went to bed until the time you got up in the morning?  3 - 3 times  Total score:  0-7 mildly symptomatic   8-19 moderately symptomatic   20-35 severely symptomatic     (below copied from [facility] records):  Robert Richards is a 79 year-old male established patient who is here evaluation for treatment of prostate cancer.  His prostate cancer was diagnosed 07/03/2018. His PSA at his time of diagnosis was 10.7.   6.2.2020: TRUS/Bx for PSA of 10.7. Volume 68 ml, PSAD 0.16.  6/12 cores positive--  4 revealed GS 3+3 pattern  1 revealed GS 3+4 pattern  1 revealed GS 4+4 pattern   6.29.2020: CT A/P  1. Ill-defined area of asymmetric sclerosis in the right iliac bone  is noted. Cannot rule out osseous metastatic disease.  2. No findings to suggest solid organ metastasis or nodal metastasis  within the abdomen or pelvis.  3. There is a hyperdense lesion within the upper pole of right  kidney measuring 9 mm and 93 HU. Primary differential  considerations  include small renal cell carcinoma versus hemorrhagic/hyperdense  cyst. Suggest further evaluation with contrast enhanced MRI of the  kidneys.  4. Aortic Atherosclerosis   8.7.2020: Bone scan  Sites of abnormal tracer localization at the posterior RIGHT  eleventh rib and at the RIGHT iliac bone, cannot exclude osseous  metastatic disease.   8.11.2020: At today's follow-up he reports having had boney pain in his right pelvis for 3-4 mo's.   8.18.2020: Here for Firmagon injection to commence with LTADT as primary mgmt of probable metastatic PCa.   9.15.2020: He has not had a repeat PSA this month. He tolerated firmagon injections well but he thinks this made him urinate a lot more frequently (7-9x nocturia one night). He has also been having hot flashes since his first injection.    10/16/18 04/10/18 06/05/17 03/10/17  Total PSA 4.8 ng/dl 10.7 ng/dl 10.1 ng/dl 10.3 ng/dl    BPH:  Patient is currently treated with Tamsulosin for his symptoms.   11.26.2019: Presented with BPH w/ LUTS. IPSS 26 QOL score 3. He was started on tamsulosin.   3.10.2020: IPSS score now is 21, but he is quite happy with the improvement in urination using tamsulosin. No significant SE's.   9.15.2020: With starting ADT he states that his urination  has become much more frequent but it is apparently beginning to return to a more baseline pattern. He also continues on tamsulosin.    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 If you have any questions, ask your nurse or doctor.        amLODipine 10 MG tablet Commonly known as: NORVASC Take 10 mg by mouth at bedtime.   Eliquis 5 MG Tabs tablet Generic drug: apixaban   metoprolol tartrate 50 MG tablet Commonly known as: LOPRESSOR   tamsulosin 0.4 MG Caps capsule Commonly known as: FLOMAX Take 1 capsule (0.4 mg total) by mouth daily.       Allergies: No Known Allergies  Family History  Problem Relation Age of Onset   Heart attack Father    Diabetes Mother    Heart Problems Mother    Gout Mother     Social History:  reports that he has quit smoking. His smoking use included cigarettes. He has a 5.00 pack-year smoking history. He has never used smokeless tobacco. He reports current alcohol use of about 1.0 standard drink of alcohol per week. He reports that he does not use drugs.  ROS: A complete review of systems was performed.  All systems are negative except for pertinent findings as noted.  Physical Exam:  Vital signs in last 24 hours: There were no vitals taken for this visit. Constitutional:  Alert and oriented, No acute distress Cardiovascular: Regular rate  Respiratory: Normal respiratory effort GI: Abdomen is soft, nontender, nondistended, no abdominal masses. No CVAT. No hernias.  Genitourinary: Normal uncircumcised male phallus, testes are atrophic, descended bilaterally and non-tender and without masses, scrotum is normal in appearance without lesions or masses, perineum is normal on inspection. Prostate feels about 40 grams. Neurologic: Grossly intact, no focal deficits Psychiatric: Normal mood and affect  Laboratory Data:  No results for input(s): WBC, HGB, HCT, PLT in the last 72 hours.  No results for input(s): NA, K, CL, GLUCOSE, BUN, CALCIUM, CREATININE in the last 72 hours.  Invalid input(s): CO3   No results found for this or any previous visit  (from the past 24 hour(s)). No results found for this or any previous visit (from the past 240 hour(s)).  Renal Function: No results for input(s): CREATININE in the last 168 hours. CrCl cannot be calculated (Patient's most recent lab result is older than the maximum 21 days allowed.).  Radiologic Imaging: No results found.  Impression/Assessment:  Metastatic PCa--PSA increasing, more bony pain  Plan:  1. Pt advised regarding dietary supplementation to maintain his bone health - increase calcium intake.   2. Pt to f/u with a bone scan in the next month.  3. Pt will receive Lupron shot today.  4. F/U in 4 months for OV, PSA, and symptom recheck.  CC: Clemmie Krill, [place]

## 2019-11-20 DIAGNOSIS — C7951 Secondary malignant neoplasm of bone: Secondary | ICD-10-CM | POA: Diagnosis not present

## 2019-11-20 DIAGNOSIS — N400 Enlarged prostate without lower urinary tract symptoms: Secondary | ICD-10-CM | POA: Diagnosis not present

## 2019-11-20 DIAGNOSIS — D692 Other nonthrombocytopenic purpura: Secondary | ICD-10-CM | POA: Diagnosis not present

## 2019-11-20 DIAGNOSIS — Z7901 Long term (current) use of anticoagulants: Secondary | ICD-10-CM | POA: Diagnosis not present

## 2019-11-20 DIAGNOSIS — I739 Peripheral vascular disease, unspecified: Secondary | ICD-10-CM | POA: Diagnosis not present

## 2019-11-20 DIAGNOSIS — F17200 Nicotine dependence, unspecified, uncomplicated: Secondary | ICD-10-CM | POA: Diagnosis not present

## 2019-11-20 DIAGNOSIS — C61 Malignant neoplasm of prostate: Secondary | ICD-10-CM | POA: Diagnosis not present

## 2019-11-20 DIAGNOSIS — Z6828 Body mass index (BMI) 28.0-28.9, adult: Secondary | ICD-10-CM | POA: Diagnosis not present

## 2019-11-20 LAB — PSA: Prostate Specific Ag, Serum: 6.2 ng/mL — ABNORMAL HIGH (ref 0.0–4.0)

## 2019-11-21 ENCOUNTER — Telehealth: Payer: Self-pay

## 2019-11-21 NOTE — Telephone Encounter (Signed)
Pt  notified of PSA increase.

## 2019-11-21 NOTE — Telephone Encounter (Signed)
-----   Message from Franchot Gallo, MD sent at 11/20/2019  8:36 PM EDT ----- Notify pt--psa up some give # ----- Message ----- From: Valentina Lucks, LPN Sent: [id number]/9794  12:55 PM EDT To: Franchot Gallo, MD  Pls review.

## 2019-11-22 DIAGNOSIS — M25552 Pain in left hip: Secondary | ICD-10-CM | POA: Diagnosis not present

## 2019-11-22 DIAGNOSIS — M5459 Other low back pain: Secondary | ICD-10-CM | POA: Diagnosis not present

## 2019-11-22 DIAGNOSIS — Z6828 Body mass index (BMI) 28.0-28.9, adult: Secondary | ICD-10-CM | POA: Diagnosis not present

## 2019-12-02 ENCOUNTER — Other Ambulatory Visit: Payer: Self-pay

## 2019-12-02 ENCOUNTER — Encounter (HOSPITAL_COMMUNITY)
Admission: RE | Admit: 2019-12-02 | Discharge: 2019-12-02 | Disposition: A | Discharge status: Home / Self Care | Payer: Medicare HMO | Source: Ambulatory Visit | Attending: Urology | Admitting: Urology

## 2019-12-02 ENCOUNTER — Encounter (HOSPITAL_COMMUNITY): Payer: Self-pay

## 2019-12-02 DIAGNOSIS — C61 Malignant neoplasm of prostate: Secondary | ICD-10-CM | POA: Insufficient documentation

## 2019-12-02 DIAGNOSIS — M19072 Primary osteoarthritis, left ankle and foot: Secondary | ICD-10-CM | POA: Diagnosis not present

## 2019-12-02 MED ORDER — TECHNETIUM TC 99M MEDRONATE IV KIT
20.0000 | PACK | Freq: Once | INTRAVENOUS | Status: AC | PRN
  Administered 2019-12-02: 19 via INTRAVENOUS

## 2019-12-03 ENCOUNTER — Telehealth: Payer: Self-pay

## 2019-12-03 NOTE — Progress Notes (Signed)
Letter mailed

## 2019-12-03 NOTE — Telephone Encounter (Signed)
-----   Message from Franchot Gallo, MD sent at 12/03/2019  1:37 PM EDT ----- Notify patient that bone scan looked great-previous spots of cancer have disappeared ----- Message ----- From: Iris Pert, LPN Sent: 49/07/261   9:02 AM EDT To: Franchot Gallo, MD  Please review

## 2019-12-10 ENCOUNTER — Telehealth: Payer: Self-pay

## 2019-12-10 NOTE — Telephone Encounter (Signed)
Pt called asking for bone scan results. Message sent to MD

## 2019-12-10 NOTE — Telephone Encounter (Signed)
Let pt know that bone scan looks much better than Summer 2020--responding well to treatment

## 2019-12-11 NOTE — Telephone Encounter (Signed)
Pt notified of results

## 2019-12-19 DIAGNOSIS — Z8249 Family history of ischemic heart disease and other diseases of the circulatory system: Secondary | ICD-10-CM | POA: Diagnosis not present

## 2019-12-19 DIAGNOSIS — N401 Enlarged prostate with lower urinary tract symptoms: Secondary | ICD-10-CM | POA: Diagnosis not present

## 2019-12-19 DIAGNOSIS — I10 Essential (primary) hypertension: Secondary | ICD-10-CM | POA: Diagnosis not present

## 2019-12-19 DIAGNOSIS — Z86711 Personal history of pulmonary embolism: Secondary | ICD-10-CM | POA: Diagnosis not present

## 2019-12-19 DIAGNOSIS — R079 Chest pain, unspecified: Secondary | ICD-10-CM | POA: Diagnosis not present

## 2019-12-19 DIAGNOSIS — Z8546 Personal history of malignant neoplasm of prostate: Secondary | ICD-10-CM | POA: Diagnosis not present

## 2019-12-19 DIAGNOSIS — Z86718 Personal history of other venous thrombosis and embolism: Secondary | ICD-10-CM | POA: Diagnosis not present

## 2019-12-19 DIAGNOSIS — F17200 Nicotine dependence, unspecified, uncomplicated: Secondary | ICD-10-CM | POA: Diagnosis not present

## 2019-12-19 DIAGNOSIS — Z7901 Long term (current) use of anticoagulants: Secondary | ICD-10-CM | POA: Diagnosis not present

## 2019-12-19 DIAGNOSIS — R0789 Other chest pain: Secondary | ICD-10-CM | POA: Diagnosis not present

## 2019-12-19 DIAGNOSIS — E785 Hyperlipidemia, unspecified: Secondary | ICD-10-CM | POA: Diagnosis not present

## 2019-12-19 DIAGNOSIS — R350 Frequency of micturition: Secondary | ICD-10-CM | POA: Diagnosis not present

## 2019-12-20 DIAGNOSIS — R079 Chest pain, unspecified: Secondary | ICD-10-CM | POA: Diagnosis not present

## 2020-01-10 DIAGNOSIS — H524 Presbyopia: Secondary | ICD-10-CM | POA: Diagnosis not present

## 2020-01-10 DIAGNOSIS — Z0100 Encounter for examination of eyes and vision without abnormal findings: Secondary | ICD-10-CM | POA: Diagnosis not present

## 2020-01-31 DIAGNOSIS — I10 Essential (primary) hypertension: Secondary | ICD-10-CM | POA: Diagnosis not present

## 2020-01-31 DIAGNOSIS — E876 Hypokalemia: Secondary | ICD-10-CM | POA: Diagnosis not present

## 2020-01-31 DIAGNOSIS — E7849 Other hyperlipidemia: Secondary | ICD-10-CM | POA: Diagnosis not present

## 2020-03-17 ENCOUNTER — Ambulatory Visit: Payer: Medicare HMO | Admitting: Urology

## 2020-03-17 DIAGNOSIS — C61 Malignant neoplasm of prostate: Secondary | ICD-10-CM

## 2020-03-17 NOTE — Progress Notes (Incomplete)
H&P  Chief Complaint: Prostate Cancer  History of Present Illness:   Robert Richards is a 80 year-old male established patient who is here evaluation for treatment of prostate cancer.  His prostate cancer was diagnosed 07/03/2018. His PSA at his time of diagnosis was 10.7.   11.26.2019: Presented with BPH w/ LUTS. IPSS 26 QOL score 3. He was started on tamsulosin.   3.10.2020: IPSS score now is 21, but he is quite happy with the improvement in urination using tamsulosin. No significant SE's.   6.2.2020: TRUS/Bx for PSA of 10.7. Volume 68 ml, PSAD 0.16.  6/12 cores positive--  4 revealed GS 3+3 pattern  1 revealed GS 3+4 pattern  1 revealed GS 4+4 pattern   6.29.2020: CT A/P  1. Ill-defined area of asymmetric sclerosis in the right iliac bone  is noted. Cannot rule out osseous metastatic disease.  2. No findings to suggest solid organ metastasis or nodal metastasis  within the abdomen or pelvis.  3. There is a hyperdense lesion within the upper pole of right  kidney measuring 9 mm and 93 HU. Primary differential considerations  include small renal cell carcinoma versus hemorrhagic/hyperdense  cyst. Suggest further evaluation with contrast enhanced MRI of the  kidneys.  4. Aortic Atherosclerosis   8.7.2020: Bone scan  Sites of abnormal tracer localization at the posterior RIGHT  eleventh rib and at the RIGHT iliac bone, cannot exclude osseous  metastatic disease.   8.11.2020: At today's follow-up he reports having had boney pain in his right pelvis for 3-4 mo's.   8.18.2020: Here for Firmagon injection to commence with LTADT as primary mgmt of probable metastatic PCa.   9.15.2020: He has not had a repeat PSA this month. He tolerated firmagon injections well but he thinks this made him urinate a lot more frequently (7-9x nocturia one night). He has also been having hot flashes since his first injection.With starting ADT he states that his urination has become much more  frequent but it is apparently beginning to return to a more baseline pattern. He also continues on tamsulosin.   10/16/18 04/10/18 06/05/17 03/10/17  Total PSA 4.8 ng/dl 10.7 ng/dl 10.1 ng/dl 10.3 ng/dl   2.15.2022:    Past Medical History:  Diagnosis Date  . 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 Cancer [facility])    Prostate  . Chronic low back pain 03/18/2014  . Coronary artery disease    NONOBSTRUCTIVE  . DVT (deep venous thrombosis) ([place])   . Hyperlipidemia   . Hypertension     Past Surgical History:  Procedure Laterality Date  . CARDIAC CATHETERIZATION  10/2007  . HERNIA REPAIR Bilateral   . SHOULDER SURGERY Left     Home Medications:  Allergies as of 03/17/2020   No Known Allergies     Medication List       Accurate as of March 17, 2020  1:19 PM. If you have any questions, ask your nurse or doctor.        amLODipine 10 MG tablet Commonly known as: NORVASC Take 10 mg by mouth at bedtime.   amLODipine 5 MG tablet Commonly known as: NORVASC   Eliquis 5 MG Tabs tablet Generic drug: apixaban   metoprolol tartrate 50 MG tablet Commonly known as: LOPRESSOR   tamsulosin 0.4 MG Caps capsule Commonly known as: FLOMAX Take 1 capsule (0.4 mg total) by mouth daily.       Allergies: No Known Allergies  Family History  Problem Relation Age  of Onset  . Heart attack Father   . Diabetes Mother   . Heart Problems Mother   . Gout Mother     Social History:  reports that he has quit smoking. His smoking use included cigarettes. He has a 5.00 pack-year smoking history. He has never used smokeless tobacco. He reports current alcohol use of about 1.0 standard drink of alcohol per week. He reports that he does not use drugs.  ROS: A complete review of systems was performed.  All systems are negative except for pertinent findings as noted.  Physical Exam:  Vital signs in last 24 hours: There were no vitals taken for this  visit. Constitutional:  Alert and oriented, No acute distress Cardiovascular: Regular rate  Respiratory: Normal respiratory effort GI: Abdomen is soft, nontender, nondistended, no abdominal masses. No CVAT.  Genitourinary: Normal male phallus, testes are descended bilaterally and non-tender and without masses, scrotum is normal in appearance without lesions or masses, perineum is normal on inspection. Lymphatic: No lymphadenopathy Neurologic: Grossly intact, no focal deficits Psychiatric: Normal mood and affect  Laboratory Data:  No results for input(s): WBC, HGB, HCT, PLT in the last 72 hours.  No results for input(s): NA, K, CL, GLUCOSE, BUN, CALCIUM, CREATININE in the last 72 hours.  Invalid input(s): CO3   No results found for this or any previous visit (from the past 24 hour(s)). No results found for this or any previous visit (from the past 240 hour(s)).  Renal Function: No results for input(s): CREATININE in the last 168 hours. CrCl cannot be calculated (Patient's most recent lab result is older than the maximum 21 days allowed.).  Radiologic Imaging: No results found.  Impression/Assessment:  ***  Plan:  ***

## 2020-03-30 DIAGNOSIS — R252 Cramp and spasm: Secondary | ICD-10-CM | POA: Diagnosis not present

## 2020-04-08 ENCOUNTER — Telehealth: Payer: Self-pay

## 2020-04-08 ENCOUNTER — Other Ambulatory Visit: Payer: Self-pay

## 2020-04-08 DIAGNOSIS — N401 Enlarged prostate with lower urinary tract symptoms: Secondary | ICD-10-CM

## 2020-04-08 MED ORDER — TAMSULOSIN HCL 0.4 MG PO CAPS: 0.4000 mg | ORAL_CAPSULE | Freq: Every day | ORAL | 11 refills | Status: AC

## 2020-04-08 NOTE — Telephone Encounter (Signed)
Pt called saying he needed a refill on Tamsulosin. Pt has appointment in May. Refill was sent.

## 2020-04-16 ENCOUNTER — Telehealth: Payer: Self-pay

## 2020-04-16 NOTE — Telephone Encounter (Signed)
I called patient to move his ov appt sooner for his ADT therapy missed appt in February.   Patient is with his children in [place] after passing of their mother and won't return until May. May appt kept with patient.   MD notified of why patient missed February ADT appt.

## 2020-05-30 DIAGNOSIS — E7849 Other hyperlipidemia: Secondary | ICD-10-CM | POA: Diagnosis not present

## 2020-05-30 DIAGNOSIS — R972 Elevated prostate specific antigen [PSA]: Secondary | ICD-10-CM | POA: Diagnosis not present

## 2020-05-30 DIAGNOSIS — I10 Essential (primary) hypertension: Secondary | ICD-10-CM | POA: Diagnosis not present

## 2020-05-30 DIAGNOSIS — E876 Hypokalemia: Secondary | ICD-10-CM | POA: Diagnosis not present

## 2020-06-16 ENCOUNTER — Other Ambulatory Visit: Payer: Self-pay

## 2020-06-16 ENCOUNTER — Encounter: Payer: Self-pay | Admitting: Urology

## 2020-06-16 ENCOUNTER — Ambulatory Visit (INDEPENDENT_AMBULATORY_CARE_PROVIDER_SITE_OTHER): Payer: Medicare HMO | Admitting: Urology

## 2020-06-16 VITALS — BP 117/74 | HR 106 | Wt 176.0 lb

## 2020-06-16 DIAGNOSIS — C7951 Secondary malignant neoplasm of bone: Secondary | ICD-10-CM

## 2020-06-16 DIAGNOSIS — Z79899 Other long term (current) drug therapy: Secondary | ICD-10-CM

## 2020-06-16 DIAGNOSIS — R9721 Rising PSA following treatment for malignant neoplasm of prostate: Secondary | ICD-10-CM | POA: Diagnosis not present

## 2020-06-16 DIAGNOSIS — C61 Malignant neoplasm of prostate: Secondary | ICD-10-CM | POA: Diagnosis not present

## 2020-06-16 MED ORDER — LEUPROLIDE ACETATE (4 MONTH) 30 MG ~~LOC~~ KIT
30.0000 mg | PACK | Freq: Once | SUBCUTANEOUS | Status: AC
  Administered 2020-06-16: 30 mg via SUBCUTANEOUS

## 2020-06-16 NOTE — Addendum Note (Signed)
Addended byIris Pert on: 06/16/2020 04:28 PM   Modules accepted: Orders

## 2020-06-16 NOTE — Progress Notes (Signed)
History of Present Illness: Here for PCa followup.   6.2.2020: TRUS/Bx for PSA of 10.7. Volume 68 ml, PSAD 0.16.  6/12 cores positive--  4 revealed GS 3+3 pattern  1 revealed GS 3+4 pattern  1 revealed GS 4+4 pattern   6.29.2020: CT A/P  1. Ill-defined area of asymmetric sclerosis in the right iliac bone  is noted. Cannot rule out osseous metastatic disease.  2. No findings to suggest solid organ metastasis or nodal metastasis  within the abdomen or pelvis.  3. There is a hyperdense lesion within the upper pole of right  kidney measuring 9 mm and 93 HU. Primary differential considerations  include small renal cell carcinoma versus hemorrhagic/hyperdense  cyst. Suggest further evaluation with contrast enhanced MRI of the  kidneys.  4. Aortic Atherosclerosis   8.7.2020: Bone scan   Sites of abnormal tracer localization at the posterior RIGHT  eleventh rib and at the RIGHT iliac bone, cannot exclude osseous  metastatic disease.   8.11.2020: At today's follow-up he reports having had boney pain in his right pelvis for 3-4 mo's.   8.18.2020: Here for Firmagon injection to commence with LTADT as primary mgmt of probable metastatic PCa.   9.15.2020: He has not had a repeat PSA this month. He tolerated firmagon injections well but he thinks this made him urinate a lot more frequently (7-9x nocturia one night). He has also been having hot flashes since his first injection.   10/16/18 04/10/18 06/05/17 03/10/17  Total PSA 4.8 ng/dl 10.7 ng/dl 10.1 ng/dl 10.3 ng/dl   10.19.2021: PSA 6.2  Pt reports pain in his pelvic  bones and is experiencing moderate LUTS including urgency incontinence.  11.1.2021: Bone scan--Decreased uptake at previously seen RIGHT iliac metastasis and resolution of previously identified uptake at the posterior RIGHT eleventh rib.  Additional scattered sites of degenerative type uptake.  No scintigraphic evidence of progressive osseous metastatic  disease.  5.17.2021: It has been quite a few months since his last visit.  He is having no bony pain.  His performance status is great.  No lower urinary tract symptoms, no gross hematuria.  Not complaining of hot flashes.  He was using a cane mostly to help him walk, he has not used a cane in over 2 months.   BPH:  Patient is currently treated with Tamsulosin for his symptoms.   11.26.2019: Presented with BPH w/ LUTS. IPSS 26 QOL score 3. He was started on tamsulosin.   3.10.2020: IPSS score now is 21, but he is quite happy with the improvement in urination using tamsulosin.       Past Medical History:  Diagnosis Date  . 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 Cancer [facility])    Prostate  . Chronic low back pain 03/18/2014  . Coronary artery disease    NONOBSTRUCTIVE  . DVT (deep venous thrombosis) ([place])   . Hyperlipidemia   . Hypertension     Past Surgical History:  Procedure Laterality Date  . CARDIAC CATHETERIZATION  10/2007  . HERNIA REPAIR Bilateral   . SHOULDER SURGERY Left     Home Medications:  Allergies as of 06/16/2020   No Known Allergies     Medication List       Accurate as of Jun 16, 2020  8:06 AM. If you have any questions, ask your nurse or doctor.        amLODipine 10 MG tablet Commonly known as: NORVASC Take 10 mg by mouth at bedtime.  amLODipine 5 MG tablet Commonly known as: NORVASC   Eliquis 5 MG Tabs tablet Generic drug: apixaban   metoprolol tartrate 50 MG tablet Commonly known as: LOPRESSOR   tamsulosin 0.4 MG Caps capsule Commonly known as: FLOMAX Take 1 capsule (0.4 mg total) by mouth daily.       Allergies: No Known Allergies  Family History  Problem Relation Age of Onset  . Heart attack Father   . Diabetes Mother   . Heart Problems Mother   . Gout Mother     Social History:  reports that he has quit smoking. His smoking use included cigarettes. He has a 5.00 pack-year smoking history. He has  never used smokeless tobacco. He reports current alcohol use of about 1.0 standard drink of alcohol per week. He reports that he does not use drugs.  ROS: A complete review of systems was performed.  All systems are negative except for pertinent findings as noted.  Physical Exam:  Vital signs in last 24 hours: There were no vitals taken for this visit. Constitutional:  Alert and oriented, No acute distress Cardiovascular: Regular rate  Respiratory: Normal respiratory effort Neurologic: Grossly intact, no focal deficits Psychiatric: Normal mood and affect  I have reviewed prior pt notes  I have reviewed notes from referring/previous physicians  I have reviewed urinalysis results  I have independently reviewed prior imaging.  I reviewed most recent bone scan  I have reviewed prior PSA results--no recent PSA drawn    Impression/Assessment:  Metastatic prostate cancer, initially diagnosed June 2020.  He is on androgen deprivation therapy alone.  He has good performance status with this and is doing well currently without symptoms.  Bone scan from 6 months ago showed improving appearance of metastatic foci  Plan:  1.  We will give him a 74-month leuprolide injection today as well as check testosterone and PSA  2.  I will see him back in 4 months for his next injection, we will check blood test before

## 2020-06-16 NOTE — Progress Notes (Signed)
Urological Symptom Review  Patient is experiencing the following symptoms: Get up at night to urinate Leakage of urine   Review of Systems  Gastrointestinal (upper)  : Negative for upper GI symptoms  Gastrointestinal (lower) : Negative for lower GI symptoms  Constitutional : Negative for symptoms  Skin: Negative for skin symptoms  Eyes: Negative for eye symptoms  Ear/Nose/Throat : Negative for Ear/Nose/Throat symptoms  Hematologic/Lymphatic: Easy bruising  Cardiovascular : Leg swelling  Respiratory : Negative for respiratory symptoms  Endocrine: Negative for endocrine symptoms  Musculoskeletal: Back pain Joint pain  Neurological: Negative for neurological symptoms  Psychologic: Negative for psychiatric symptoms

## 2020-06-16 NOTE — Progress Notes (Signed)
Eligard SubQ Injection   Due to Prostate Cancer patient is present today for a Eligard Injection.  Medication: Eligard 4 month Dose: 30 mg  Location: right  Lot: [id number] Exp: 05/2021  Patient tolerated well, no complications were noted  Performed by: [place], LPN

## 2020-06-17 LAB — TESTOSTERONE: Testosterone: 302 ng/dL (ref 264–916)

## 2020-06-17 LAB — PSA: Prostate Specific Ag, Serum: 6.6 ng/mL — ABNORMAL HIGH (ref 0.0–4.0)

## 2020-06-29 DIAGNOSIS — E876 Hypokalemia: Secondary | ICD-10-CM | POA: Diagnosis not present

## 2020-06-29 DIAGNOSIS — E7849 Other hyperlipidemia: Secondary | ICD-10-CM | POA: Diagnosis not present

## 2020-06-29 DIAGNOSIS — I10 Essential (primary) hypertension: Secondary | ICD-10-CM | POA: Diagnosis not present

## 2020-06-29 DIAGNOSIS — R972 Elevated prostate specific antigen [PSA]: Secondary | ICD-10-CM | POA: Diagnosis not present

## 2020-07-07 NOTE — Progress Notes (Signed)
Results mailed 

## 2020-07-30 DIAGNOSIS — E7849 Other hyperlipidemia: Secondary | ICD-10-CM | POA: Diagnosis not present

## 2020-07-30 DIAGNOSIS — I10 Essential (primary) hypertension: Secondary | ICD-10-CM | POA: Diagnosis not present

## 2020-07-30 DIAGNOSIS — E876 Hypokalemia: Secondary | ICD-10-CM | POA: Diagnosis not present

## 2020-07-30 DIAGNOSIS — R972 Elevated prostate specific antigen [PSA]: Secondary | ICD-10-CM | POA: Diagnosis not present

## 2020-10-01 DIAGNOSIS — Z136 Encounter for screening for cardiovascular disorders: Secondary | ICD-10-CM | POA: Diagnosis not present

## 2020-10-01 DIAGNOSIS — Z86718 Personal history of other venous thrombosis and embolism: Secondary | ICD-10-CM | POA: Diagnosis not present

## 2020-10-01 DIAGNOSIS — Z85830 Personal history of malignant neoplasm of bone: Secondary | ICD-10-CM | POA: Diagnosis not present

## 2020-10-01 DIAGNOSIS — Z1329 Encounter for screening for other suspected endocrine disorder: Secondary | ICD-10-CM | POA: Diagnosis not present

## 2020-10-01 DIAGNOSIS — Z7901 Long term (current) use of anticoagulants: Secondary | ICD-10-CM | POA: Diagnosis not present

## 2020-10-01 DIAGNOSIS — Z7689 Persons encountering health services in other specified circumstances: Secondary | ICD-10-CM | POA: Diagnosis not present

## 2020-10-01 DIAGNOSIS — Z86711 Personal history of pulmonary embolism: Secondary | ICD-10-CM | POA: Diagnosis not present

## 2020-10-01 DIAGNOSIS — I10 Essential (primary) hypertension: Secondary | ICD-10-CM | POA: Diagnosis not present

## 2020-10-01 DIAGNOSIS — N401 Enlarged prostate with lower urinary tract symptoms: Secondary | ICD-10-CM | POA: Diagnosis not present

## 2020-10-02 DIAGNOSIS — Z136 Encounter for screening for cardiovascular disorders: Secondary | ICD-10-CM | POA: Diagnosis not present

## 2020-10-02 DIAGNOSIS — I10 Essential (primary) hypertension: Secondary | ICD-10-CM | POA: Diagnosis not present

## 2020-10-02 DIAGNOSIS — Z1329 Encounter for screening for other suspected endocrine disorder: Secondary | ICD-10-CM | POA: Diagnosis not present

## 2020-10-02 DIAGNOSIS — Z7901 Long term (current) use of anticoagulants: Secondary | ICD-10-CM | POA: Diagnosis not present

## 2020-10-02 DIAGNOSIS — Z86718 Personal history of other venous thrombosis and embolism: Secondary | ICD-10-CM | POA: Diagnosis not present

## 2020-10-02 DIAGNOSIS — Z86711 Personal history of pulmonary embolism: Secondary | ICD-10-CM | POA: Diagnosis not present

## 2020-10-20 ENCOUNTER — Ambulatory Visit: Payer: Medicare HMO | Admitting: Urology

## 2020-10-20 DIAGNOSIS — C7951 Secondary malignant neoplasm of bone: Secondary | ICD-10-CM

## 2020-10-20 DIAGNOSIS — R9721 Rising PSA following treatment for malignant neoplasm of prostate: Secondary | ICD-10-CM

## 2020-10-20 DIAGNOSIS — C61 Malignant neoplasm of prostate: Secondary | ICD-10-CM

## 2020-10-23 ENCOUNTER — Telehealth: Payer: Self-pay | Admitting: Urology

## 2020-10-23 DIAGNOSIS — H2513 Age-related nuclear cataract, bilateral: Secondary | ICD-10-CM | POA: Diagnosis not present

## 2020-10-23 NOTE — Telephone Encounter (Signed)
Patient moved to [facility] and is requesting records to be sent to :

## 2020-11-04 DIAGNOSIS — C7951 Secondary malignant neoplasm of bone: Secondary | ICD-10-CM | POA: Diagnosis not present

## 2020-11-04 DIAGNOSIS — C61 Malignant neoplasm of prostate: Secondary | ICD-10-CM | POA: Diagnosis not present

## 2020-11-10 NOTE — Telephone Encounter (Signed)
[address] [place] [address]

## 2020-11-27 ENCOUNTER — Telehealth: Payer: Self-pay

## 2020-11-27 NOTE — Telephone Encounter (Signed)
Patient records mailed

## 2020-12-04 DIAGNOSIS — C7951 Secondary malignant neoplasm of bone: Secondary | ICD-10-CM | POA: Diagnosis not present

## 2020-12-04 DIAGNOSIS — C61 Malignant neoplasm of prostate: Secondary | ICD-10-CM | POA: Diagnosis not present

## 2020-12-11 DIAGNOSIS — C61 Malignant neoplasm of prostate: Secondary | ICD-10-CM | POA: Diagnosis not present

## 2020-12-11 DIAGNOSIS — I82513 Chronic embolism and thrombosis of femoral vein, bilateral: Secondary | ICD-10-CM | POA: Diagnosis not present

## 2020-12-11 DIAGNOSIS — I82401 Acute embolism and thrombosis of unspecified deep veins of right lower extremity: Secondary | ICD-10-CM | POA: Diagnosis not present

## 2021-02-28 IMAGING — NM NUCLEAR MEDICINE WHOLE BODY BONE SCINTIGRAPHY
2 series · 2 of 2 positions shown · non-contrast
Comparison: None

Radiographic correlation: CT abdomen and pelvis 07/30/2018

CLINICAL DATA: Prostate cancer, BILATERAL hip and knee pain

EXAM:
NUCLEAR MEDICINE WHOLE BODY BONE SCAN
TECHNIQUE: Whole body anterior and posterior images were obtained approximately
3 hours after intravenous injection of radiopharmaceutical.
RADIOPHARMACEUTICALS:  20.5 mCi Pechnetium-JJm MDP IV

[Series 1: whole body · 2.66mm/px · 1 of 1 slices shown (1 of 2)]
[im 1/1]
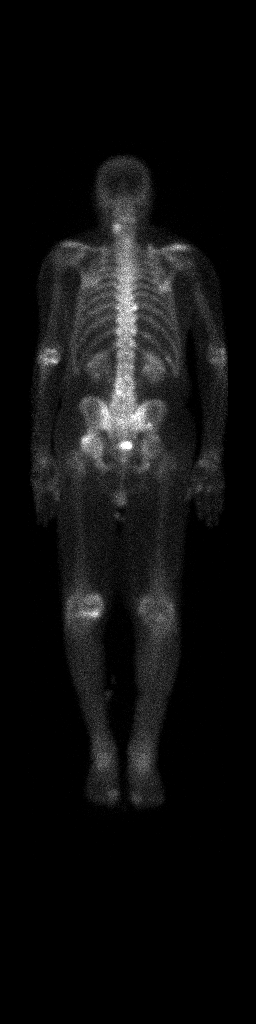

[Series 1: whole body · 2.66mm/px · 1 of 1 slices shown (2 of 2)]
[im 1/1]
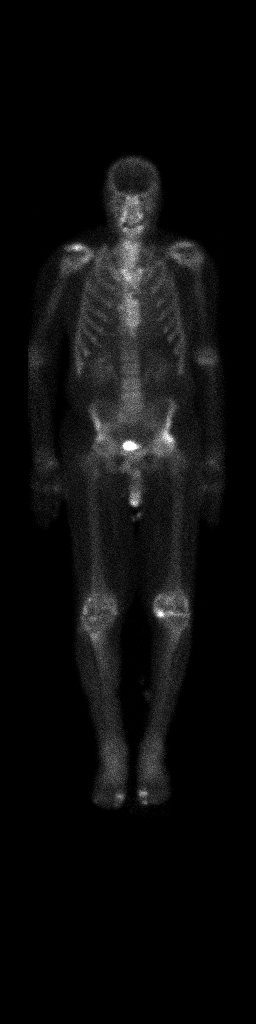

[2 of 2 positions shown; findings below may reference images not displayed]

FINDINGS: Uptake at the LEFT lateral aspect of the cervical spine,
sternoclavicular joints, RIGHT shoulder, elbows, hips, knees, and
great toes, typically degenerative.

Abnormal tracer uptake posterior RIGHT eleventh rib, nonspecific;
rib appears mildly expanded at this site on CT, could represent a
healing fracture or a subtle metastatic lesion.

Uptake at the RIGHT iliac bone, an area of mixed lytic and sclerotic
attenuation on CT, metastatic lesion not excluded.

No additional sites of worrisome osseous tracer accumulation.
IMPRESSION: Sites of abnormal tracer localization at the posterior RIGHT
eleventh rib and at the RIGHT iliac bone, cannot exclude osseous
metastatic disease.

## 2022-05-25 IMAGING — NM NM BONE WHOLE BODY
2 series · 2 of 2 positions shown · non-contrast
Comparison: 09/07/2018

CLINICAL DATA: Prostate cancer, staging

EXAM:
NUCLEAR MEDICINE WHOLE BODY BONE SCAN
TECHNIQUE: Whole body anterior and posterior images were obtained approximately
3 hours after intravenous injection of radiopharmaceutical.
RADIOPHARMACEUTICALS:  19 mCi 1echnetium-JJm MDP IV

[Series 1: whole body · 2.66mm/px · 1 of 1 slices shown (1 of 2)]
[im 1/1]
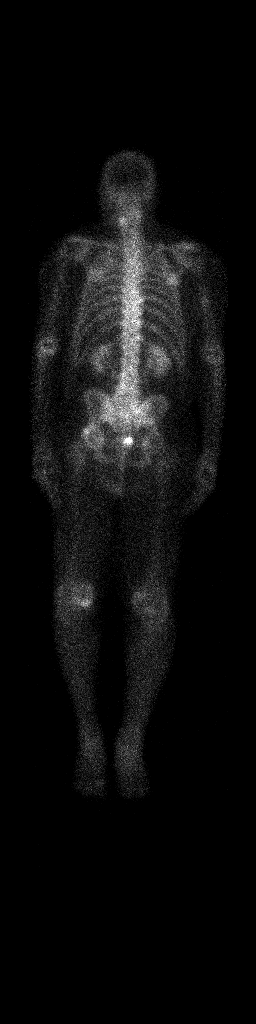

[Series 1: whole body · 2.66mm/px · 1 of 1 slices shown (2 of 2)]
[im 1/1]
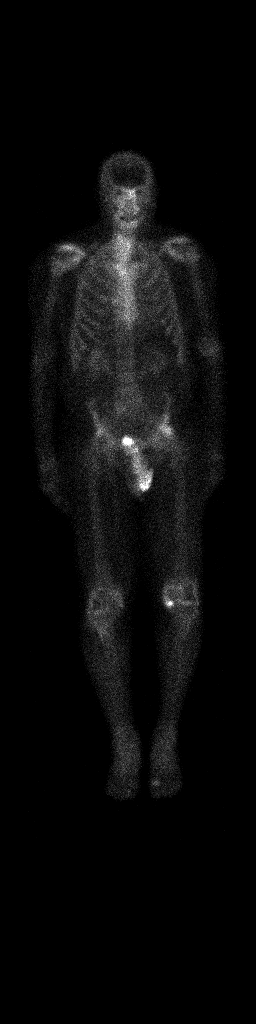

[2 of 2 positions shown; findings below may reference images not displayed]

FINDINGS: Uptake in shoulders, sternoclavicular joints, hips, knees, LEFT
foot, typically degenerative.

Uptake in lower lumbar spine laterally and at LEFT lateral aspect of
cervical spine, typically degenerative.

Again identified focus of uptake at RIGHT iliac bone, decreased in
intensity since previous exam.

Resolution of previously identified abnormal uptake at the posterior
RIGHT eleventh rib.

No new sites of abnormal osseous tracer uptake are identified.

Minimal uptake at several RIGHT costovertebral junctions, typically
degenerative.

Expected urinary tract and soft tissue distribution of tracer.
IMPRESSION: Decreased uptake at previously seen RIGHT iliac metastasis and
resolution of previously identified uptake at the posterior RIGHT
eleventh rib.

Additional scattered sites of degenerative type uptake.

No scintigraphic evidence of progressive osseous metastatic disease.
